# Patient Record
Sex: Female | Born: 1970 | Race: White | Hispanic: No | Marital: Married | Smoking: Current every day smoker
Health system: Southern US, Community
[De-identification: ages and names within clinical notes are randomized; demographics above are authoritative.]

## PROBLEM LIST (undated history)

## (undated) DIAGNOSIS — K219 Gastro-esophageal reflux disease without esophagitis: Secondary | ICD-10-CM

## (undated) HISTORY — DX: Gastro-esophageal reflux disease without esophagitis: K21.9

---

## 2016-04-16 HISTORY — PX: CERVICAL DISCECTOMY: SHX98

## 2019-11-15 HISTORY — PX: OTHER SURGICAL HISTORY: SHX169

## 2020-04-22 ENCOUNTER — Encounter (HOSPITAL_COMMUNITY): Payer: Self-pay

## 2020-04-22 ENCOUNTER — Other Ambulatory Visit: Payer: Self-pay

## 2020-04-22 DIAGNOSIS — N7093 Salpingitis and oophoritis, unspecified: Principal | ICD-10-CM | POA: Diagnosis present

## 2020-04-22 DIAGNOSIS — F17210 Nicotine dependence, cigarettes, uncomplicated: Secondary | ICD-10-CM | POA: Diagnosis present

## 2020-04-22 DIAGNOSIS — Z20822 Contact with and (suspected) exposure to covid-19: Secondary | ICD-10-CM | POA: Diagnosis present

## 2020-04-22 NOTE — ED Triage Notes (Signed)
Pt to er, pt c/o abd pain and bloating, states that Tuesday night she started having a fever, states that she just got back from the [place] on Monday.  States that she was eating fine yesterday, but today the fever continued and she had some bloating and diarrhea.

## 2020-04-23 ENCOUNTER — Emergency Department (HOSPITAL_COMMUNITY): Payer: Commercial Managed Care - PPO

## 2020-04-23 ENCOUNTER — Inpatient Hospital Stay (HOSPITAL_COMMUNITY)
Admission: EM | Admit: 2020-04-23 | Discharge: 2020-04-26 | DRG: 759 | Disposition: A | Discharge status: Home / Self Care | Payer: Commercial Managed Care - PPO | Attending: Obstetrics and Gynecology | Admitting: Obstetrics and Gynecology

## 2020-04-23 DIAGNOSIS — F17210 Nicotine dependence, cigarettes, uncomplicated: Secondary | ICD-10-CM | POA: Diagnosis not present

## 2020-04-23 DIAGNOSIS — Z20822 Contact with and (suspected) exposure to covid-19: Secondary | ICD-10-CM | POA: Diagnosis not present

## 2020-04-23 DIAGNOSIS — N7093 Salpingitis and oophoritis, unspecified: Secondary | ICD-10-CM | POA: Diagnosis present

## 2020-04-23 LAB — URINALYSIS, ROUTINE W REFLEX MICROSCOPIC
Bilirubin Urine: NEGATIVE
Glucose, UA: NEGATIVE mg/dL
Hgb urine dipstick: NEGATIVE
Ketones, ur: 20 mg/dL — AB
Nitrite: NEGATIVE
Protein, ur: 30 mg/dL — AB
Specific Gravity, Urine: 1.015 (ref 1.005–1.030)
WBC, UA: >50 WBC/hpf — ABNORMAL HIGH (ref 0–5)
pH: 7 (ref 5.0–8.0)

## 2020-04-23 LAB — COMPREHENSIVE METABOLIC PANEL
ALT: 12 U/L (ref 0–44)
AST: 12 U/L — ABNORMAL LOW (ref 15–41)
Albumin: 3.2 g/dL — ABNORMAL LOW (ref 3.5–5.0)
Alkaline Phosphatase: 51 U/L (ref 38–126)
Anion gap: 10 (ref 5–15)
BUN: 9 mg/dL (ref 6–20)
CO2: 28 mmol/L (ref 22–32)
Calcium: 8.1 mg/dL — ABNORMAL LOW (ref 8.9–10.3)
Chloride: 98 mmol/L (ref 98–111)
Creatinine, Ser: 0.62 mg/dL (ref 0.44–1.00)
GFR, Estimated: >60 mL/min (ref >60)
Glucose, Bld: 97 mg/dL (ref 70–99)
Potassium: 3.1 mmol/L — ABNORMAL LOW (ref 3.5–5.1)
Sodium: 136 mmol/L (ref 135–145)
Total Bilirubin: 0.7 mg/dL (ref 0.3–1.2)
Total Protein: 6.4 g/dL — ABNORMAL LOW (ref 6.5–8.1)

## 2020-04-23 LAB — CBC WITH DIFFERENTIAL/PLATELET
Abs Immature Granulocytes: 0.16 10*3/uL — ABNORMAL HIGH (ref 0.00–0.07)
Basophils Absolute: 0.1 10*3/uL (ref 0.0–0.1)
Basophils Relative: 0 %
Eosinophils Absolute: 0 10*3/uL (ref 0.0–0.5)
Eosinophils Relative: 0 %
HCT: 38.2 % (ref 36.0–46.0)
Hemoglobin: 12.5 g/dL (ref 12.0–15.0)
Immature Granulocytes: 1 %
Lymphocytes Relative: 10 %
Lymphs Abs: 1.7 10*3/uL (ref 0.7–4.0)
MCH: 27.8 pg (ref 26.0–34.0)
MCHC: 32.7 g/dL (ref 30.0–36.0)
MCV: 85.1 fL (ref 80.0–100.0)
Monocytes Absolute: 1.2 10*3/uL — ABNORMAL HIGH (ref 0.1–1.0)
Monocytes Relative: 7 %
Neutro Abs: 13.3 10*3/uL — ABNORMAL HIGH (ref 1.7–7.7)
Neutrophils Relative %: 82 %
Platelets: 189 10*3/uL (ref 150–400)
RBC: 4.49 MIL/uL (ref 3.87–5.11)
RDW: 13.2 % (ref 11.5–15.5)
WBC: 16.5 10*3/uL — ABNORMAL HIGH (ref 4.0–10.5)
nRBC: 0 % (ref 0.0–0.2)

## 2020-04-23 LAB — LIPASE, BLOOD: Lipase: 14 U/L (ref 11–51)

## 2020-04-23 LAB — SARS CORONAVIRUS 2 (TAT 6-24 HRS): SARS Coronavirus 2: NEGATIVE

## 2020-04-23 LAB — HCG, QUANTITATIVE, PREGNANCY: hCG, Beta Chain, Quant, S: <1 m[IU]/mL (ref <5)

## 2020-04-23 MED ORDER — DOXYCYCLINE HYCLATE 100 MG PO TABS
100.0000 mg | ORAL_TABLET | Freq: Two times a day (BID) | ORAL | Status: DC
Start: 2020-04-23 — End: 2020-04-26
  Administered 2020-04-23 – 2020-04-26 (×6): 100 mg via ORAL
  Filled 2020-04-23 (×6): qty 1

## 2020-04-23 MED ORDER — SODIUM CHLORIDE 0.9 % IV SOLN: 250.0000 mL | INTRAVENOUS | Status: DC | PRN

## 2020-04-23 MED ORDER — METRONIDAZOLE 500 MG PO TABS
500.0000 mg | ORAL_TABLET | Freq: Once | ORAL | Status: AC
  Administered 2020-04-23: 500 mg via ORAL
  Filled 2020-04-23: qty 1

## 2020-04-23 MED ORDER — IBUPROFEN 800 MG PO TABS
800.0000 mg | ORAL_TABLET | Freq: Three times a day (TID) | ORAL | Status: DC | PRN
  Administered 2020-04-24: 800 mg via ORAL
  Filled 2020-04-23: qty 1

## 2020-04-23 MED ORDER — SODIUM CHLORIDE 0.9 % IV SOLN
1.0000 g | INTRAVENOUS | Status: DC
  Administered 2020-04-23 – 2020-04-24 (×2): 1 g via INTRAVENOUS
  Filled 2020-04-23 (×2): qty 10

## 2020-04-23 MED ORDER — FENTANYL CITRATE (PF) 100 MCG/2ML IJ SOLN
100.0000 ug | INTRAMUSCULAR | Status: DC | PRN
  Administered 2020-04-23 (×2): 100 ug via INTRAVENOUS
  Filled 2020-04-23 (×2): qty 2

## 2020-04-23 MED ORDER — IOHEXOL 300 MG/ML  SOLN
100.0000 mL | Freq: Once | INTRAMUSCULAR | Status: AC | PRN
  Administered 2020-04-23: 100 mL via INTRAVENOUS

## 2020-04-23 MED ORDER — SODIUM CHLORIDE 0.9 % IV SOLN: 100.0000 mg | Freq: Two times a day (BID) | INTRAVENOUS | Status: DC

## 2020-04-23 MED ORDER — METRONIDAZOLE 500 MG PO TABS
500.0000 mg | ORAL_TABLET | Freq: Two times a day (BID) | ORAL | Status: DC
  Administered 2020-04-23 – 2020-04-26 (×6): 500 mg via ORAL
  Filled 2020-04-23 (×6): qty 1

## 2020-04-23 MED ORDER — SODIUM CHLORIDE 0.9% FLUSH
3.0000 mL | Freq: Two times a day (BID) | INTRAVENOUS | Status: DC
  Administered 2020-04-23 – 2020-04-25 (×5): 3 mL via INTRAVENOUS

## 2020-04-23 MED ORDER — ZOLPIDEM TARTRATE 5 MG PO TABS
5.0000 mg | ORAL_TABLET | Freq: Every evening | ORAL | Status: DC | PRN
  Administered 2020-04-25: 5 mg via ORAL
  Filled 2020-04-23: qty 1

## 2020-04-23 MED ORDER — ONDANSETRON HCL 4 MG/2ML IJ SOLN
4.0000 mg | Freq: Once | INTRAMUSCULAR | Status: AC
  Administered 2020-04-23: 4 mg via INTRAVENOUS
  Filled 2020-04-23: qty 2

## 2020-04-23 MED ORDER — DOXYCYCLINE HYCLATE 100 MG PO TABS
100.0000 mg | ORAL_TABLET | Freq: Once | ORAL | Status: AC
  Administered 2020-04-23: 100 mg via ORAL
  Filled 2020-04-23: qty 1

## 2020-04-23 MED ORDER — SODIUM CHLORIDE 0.9% FLUSH: 3.0000 mL | INTRAVENOUS | Status: DC | PRN

## 2020-04-23 MED ORDER — ONDANSETRON HCL 4 MG PO TABS: 4.0000 mg | ORAL_TABLET | Freq: Four times a day (QID) | ORAL | Status: DC | PRN

## 2020-04-23 MED ORDER — FENTANYL CITRATE (PF) 100 MCG/2ML IJ SOLN
50.0000 ug | Freq: Once | INTRAMUSCULAR | Status: AC
  Administered 2020-04-23: 50 ug via INTRAVENOUS
  Filled 2020-04-23: qty 2

## 2020-04-23 MED ORDER — SODIUM CHLORIDE 0.9 % IV BOLUS (SEPSIS)
1000.0000 mL | Freq: Once | INTRAVENOUS | Status: AC
  Administered 2020-04-23: 1000 mL via INTRAVENOUS

## 2020-04-23 MED ORDER — HYDROMORPHONE HCL 1 MG/ML IJ SOLN
1.0000 mg | INTRAMUSCULAR | Status: DC | PRN
  Administered 2020-04-23 – 2020-04-24 (×4): 1 mg via INTRAVENOUS
  Filled 2020-04-23 (×4): qty 1

## 2020-04-23 MED ORDER — FENTANYL CITRATE (PF) 100 MCG/2ML IJ SOLN
100.0000 ug | Freq: Once | INTRAMUSCULAR | Status: AC
Start: 2020-04-23 — End: 2020-04-23
  Administered 2020-04-23: 100 ug via INTRAVENOUS
  Filled 2020-04-23: qty 2

## 2020-04-23 MED ORDER — ONDANSETRON HCL 4 MG/2ML IJ SOLN
4.0000 mg | Freq: Four times a day (QID) | INTRAMUSCULAR | Status: DC | PRN
  Administered 2020-04-24: 4 mg via INTRAVENOUS
  Filled 2020-04-23: qty 2

## 2020-04-23 MED ORDER — SODIUM CHLORIDE 0.9 % IV SOLN
1.0000 g | Freq: Once | INTRAVENOUS | Status: AC
  Administered 2020-04-23: 1 g via INTRAVENOUS
  Filled 2020-04-23: qty 10

## 2020-04-23 MED ORDER — OXYCODONE-ACETAMINOPHEN 5-325 MG PO TABS
1.0000 | ORAL_TABLET | ORAL | Status: DC | PRN
  Administered 2020-04-24 (×3): 2 via ORAL
  Administered 2020-04-24: 1 via ORAL
  Administered 2020-04-25 (×2): 2 via ORAL
  Filled 2020-04-23: qty 1
  Filled 2020-04-23 (×6): qty 2

## 2020-04-23 MED ORDER — PANTOPRAZOLE SODIUM 40 MG PO TBEC
40.0000 mg | DELAYED_RELEASE_TABLET | Freq: Every day | ORAL | Status: DC
  Administered 2020-04-23 – 2020-04-26 (×4): 40 mg via ORAL
  Filled 2020-04-23 (×4): qty 1

## 2020-04-23 NOTE — ED Provider Notes (Signed)
[facility] EMERGENCY DEPARTMENT Provider Note   CSN: [id number] Arrival date & time: 04/22/20  2016     History Chief Complaint  Patient presents with  . Abdominal Pain    Meredith Hansen is a 50 y.o. female.  The history is provided by the patient.  Abdominal Pain Pain location:  Generalized Pain quality: aching and bloating   Pain radiates to:  Does not radiate Pain severity:  Severe Onset quality:  Gradual Duration:  3 days Timing:  Constant Progression:  Worsening Chronicity:  New Relieved by:  Nothing Worsened by:  Movement and palpation Associated symptoms: cough and fever   Associated symptoms: no chest pain, no diarrhea, no dysuria, no hematemesis, no vaginal bleeding, no vaginal discharge and no vomiting    Patient presents with abdominal pain.  Patient returned from the [place] around January 03.  Approximately 2 days later she began having generalized abdominal pain and fevers.  She had no vomiting or diarrhea at that time.  Today she took a suppository as well as Gas-X and Imodium.  She had some loose stool.  She reports the abdominal pain intensified today. She has never had this before She reports extensive traveling while in [place] including time in the [place]. She had a negative Covid test to re-enter the [place], and had a negative rapid test at home earlier in the day    PMH-none Soc hx - smoker  OB History   No obstetric history on file.     History reviewed. No pertinent family history.  Social History   Tobacco Use  . Smoking status: Current Every Day Smoker    Packs/day: 0.25    Types: Cigarettes  . Smokeless tobacco: Never Used  Vaping Use  . Vaping Use: Every day  Substance Use Topics  . Alcohol use: Never  . Drug use: Never    Home Medications Prior to Admission medications   Not on File    Allergies    Patient has no known allergies.  Review of Systems   Review of Systems  Constitutional:  Positive for fever.  Respiratory: Positive for cough.   Cardiovascular: Negative for chest pain.  Gastrointestinal: Positive for abdominal pain. Negative for diarrhea, hematemesis and vomiting.  Genitourinary: Negative for dysuria, vaginal bleeding and vaginal discharge.  All other systems reviewed and are negative.   Physical Exam Updated Vital Signs BP 110/71 (BP Location: Left Arm)   Pulse 96   Temp 98.7 F (37.1 C) (Oral)   Resp 16   Ht 1.753 m (5\' 9" )   Wt 90.7 kg   SpO2 94%   BMI 29.53 kg/m   Physical Exam CONSTITUTIONAL: Well developed/well nourished, uncomfortable appearing HEAD: Normocephalic/atraumatic EYES: EOMI/PERRL no icterus ENMT: Mucous membranes moist NECK: supple no meningeal signs SPINE/BACK:entire spine nontender CV: S1/S2 noted, no murmurs/rubs/gallops noted LUNGS: Lungs are clear to auscultation bilaterally, no apparent distress ABDOMEN: soft, diffuse moderate tenderness noted, there is guarding, bowel sounds noted throughout abdomen GU:no cva tenderness NEURO: Pt is awake/alert/appropriate, moves all extremitiesx4.  No facial droop.   EXTREMITIES: pulses normal/equal, full ROM SKIN: warm, color normal PSYCH: Mildly anxious  ED Results / Procedures / Treatments   Labs (all labs ordered are listed, but only abnormal results are displayed) Labs Reviewed  CBC WITH DIFFERENTIAL/PLATELET - Abnormal; Notable for the following components:      Result Value   WBC 16.5 (*)    Neutro Abs 13.3 (*)    Monocytes Absolute 1.2 (*)  Abs Immature Granulocytes 0.16 (*)    All other components within normal limits  COMPREHENSIVE METABOLIC PANEL - Abnormal; Notable for the following components:   Potassium 3.1 (*)    Calcium 8.1 (*)    Total Protein 6.4 (*)    Albumin 3.2 (*)    AST 12 (*)    All other components within normal limits  URINALYSIS, ROUTINE W REFLEX MICROSCOPIC - Abnormal; Notable for the following components:   APPearance HAZY (*)     Ketones, ur 20 (*)    Protein, ur 30 (*)    Leukocytes,Ua SMALL (*)    WBC, UA >50 (*)    Bacteria, UA RARE (*)    All other components within normal limits  SARS CORONAVIRUS 2 (TAT 6-24 HRS)  LIPASE, BLOOD  HCG, QUANTITATIVE, PREGNANCY  GC/CHLAMYDIA PROBE AMP ([place]) NOT AT [facility]    EKG None  Radiology CT ABDOMEN PELVIS W CONTRAST  Result Date: 04/23/2020 CLINICAL DATA:  Acute nonlocalized abdominal pain.  Fever. EXAM: CT ABDOMEN AND PELVIS WITH CONTRAST TECHNIQUE: Multidetector CT imaging of the abdomen and pelvis was performed using the standard protocol following bolus administration of intravenous contrast. CONTRAST:  100mL OMNIPAQUE IOHEXOL 300 MG/ML  SOLN COMPARISON:  None. 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 Trace pleural thickening in the lower lobes with scattered atelectasis. Hepatobiliary: No focal hepatic abnormality. Mild gallbladder distension. Small calcified gallstones in the gallbladder fundus, with a 14 mm stellate stone in the more proximal gallbladder. No gallbladder wall thickening or pericholecystic inflammation. No biliary dilatation. Pancreas: No ductal dilatation or inflammation. Spleen: Borderline splenomegaly with spleen spanning 13.6 cm cranial caudal. No focal abnormality. Adrenals/Urinary Tract: Normal adrenal glands. No hydronephrosis. No perinephric edema. There is a punctate nonobstructing stone in the lower left kidney. Symmetric renal excretion on delayed phase imaging. The urinary bladder is minimally distended. There is no bladder wall thickening. Stomach/Bowel: Decompressed stomach. Normal positioning of the duodenum and ligament of Treitz. Occasional fluid-filled small bowel loops without abnormal distension or obstruction. Appendix is upper normal in size, filled with air and small amount of intraluminal contrast. No evidence of appendicitis. Occasional  colonic diverticula of the descending colon. No acute diverticulitis. Vascular/Lymphatic: Normal caliber abdominal aorta. Patent portal vein. Multiple prominent retroperitoneal lymph nodes, not enlarged by size criteria and typically reactive. Reproductive: Thick-walled tubular structure in the left greater than right adnexa suspicious for tubo-ovarian abscess. There is generalized fat stranding of the pelvic fat. Small amount of free fluid tracks into the pericolic gutters and adnexa. Uterus is slightly heterogeneous. Other: Abdominoplasty.  No free air.  Trace perihepatic ascites. Musculoskeletal: There are no acute or suspicious osseous abnormalities. IMPRESSION: 1. Thick-walled tubular structure in the left greater than right adnexa suspicious for tubo-ovarian abscess. There is generalized edema and fat stranding of the intrapelvic fat. Small amount of free fluid tracks into the pericolic gutters and adnexa. 2. Cholelithiasis without gallbladder inflammation. 3. Nonobstructing left nephrolithiasis. 4. Mild colonic diverticulosis without diverticulitis. 5. 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 Electronically Signed   By: Narda RutherfordMelanie  Sanford M.D.   On: 04/23/2020 04:09    Procedures .Critical Care Performed by: Zadie RhineWickline, Eirik Schueler, MD Authorized by: Zadie RhineWickline, Cassidee Deats, MD   Critical care provider statement:    Critical care time (minutes):  60   Critical care start time:  04/23/2020 4:30 AM  Critical care end time:  04/23/2020 5:30 AM   Critical care time was exclusive of:  Separately billable procedures and treating other patients   Critical care was necessary to treat or prevent imminent or life-threatening deterioration of the following conditions:  Sepsis and shock   Critical care was time spent personally by me on the following activities:  Development of treatment plan with patient or surrogate, discussions with consultants,  examination of patient, evaluation of patient's response to treatment, re-evaluation of patient's condition, pulse oximetry, ordering and review of radiographic studies, ordering and review of laboratory studies and ordering and performing treatments and interventions   I assumed direction of critical care for this patient from another provider in my specialty: no     Care discussed with: admitting provider       Medications Ordered in ED Medications  sodium chloride 0.9 % bolus 1,000 mL (0 mLs Intravenous Stopped 04/23/20 0452)  ondansetron (ZOFRAN) injection 4 mg (4 mg Intravenous Given 04/23/20 0229)  fentaNYL (SUBLIMAZE) injection 100 mcg (100 mcg Intravenous Given 04/23/20 0229)  iohexol (OMNIPAQUE) 300 MG/ML solution 100 mL (100 mLs Intravenous Contrast Given 04/23/20 0333)  sodium chloride 0.9 % bolus 1,000 mL (1,000 mLs Intravenous New Bag/Given 04/23/20 0501)  fentaNYL (SUBLIMAZE) injection 50 mcg (50 mcg Intravenous Given 04/23/20 0501)  cefTRIAXone (ROCEPHIN) 1 g in sodium chloride 0.9 % 100 mL IVPB (1 g Intravenous New Bag/Given 04/23/20 0503)  doxycycline (VIBRA-TABS) tablet 100 mg (100 mg Oral Given 04/23/20 0501)  metroNIDAZOLE (FLAGYL) tablet 500 mg (500 mg Oral Given 04/23/20 0501)    ED Course  I have reviewed the triage vital signs and the nursing notes.  Pertinent labs & imaging results that were available during my care of the patient were reviewed by me and considered in my medical decision making (see chart for details).    MDM Rules/Calculators/A&P                           This patient presents to the ED for concern of abdominal pain, this involves an extensive number of treatment options, and is a complaint that carries with it a high risk of complications and morbidity.  The differential diagnosis includes cholecystitis, pancreatitis, appendicitis, UTI, kidney stone, bowel perforation   Lab Tests:   I Ordered, reviewed, and interpreted labs, which included electrolytes,  complete blood count, lipase, urinalysis  Medicines ordered:   I ordered medication fentanyl for pain  Imaging Studies ordered:   I ordered imaging studies which included CT abdomen pelvis  I independently visualized and interpreted imaging which showed tubo-ovarian abscess    Consultations Obtained:   I consulted gynecology Dr Debroah Loop  and discussed lab and imaging findings  Reevaluation:  After the interventions stated above, I reevaluated the patient and found pt improved  Critical Interventions:  . IV fluids, IV antibiotics, admission to gynecology   5:47 AM Patient presented with generalized abdominal pain that began several days after returning from the [place]. Patient described bloating and pain. She had no significant diarrhea Patient had significant tenderness on exam, she was tachycardic and had an elevated white blood cell count. She required over 2 L of normal saline CT scan was ordered to rule out any acute intra-abdominal process. CT scan reveals tubo-ovarian abscess. Patient denies any pelvic complaints, and she reports she has not had sexual intercourse in over a decade Discussed the case with Dr. Debroah Loop with gynecology. He has reviewed the  work-up. Plan will be to start antibiotics and transfer to his service at [facility] Patient is agreeable plan Final Clinical Impression(s) / ED Diagnoses Final diagnoses:  TOA (tubo-ovarian abscess)    Rx / DC Orders ED Discharge Orders    None       Zadie Rhine, MD 04/23/20 [id number]

## 2020-04-23 NOTE — ED Notes (Addendum)
Instructed to call [phone] to speak to MD on call Dr. Alysia Penna to discuss.  Called and MD will put in some orders

## 2020-04-23 NOTE — ED Notes (Signed)
Pt doesn't have a bed at this time, called pt placement, it is anticipated that pt will get a bed after discharges are complete at [facility], notified MD Debroah Loop via text to determine plan of care between now and when she gets a bed at [facility]

## 2020-04-23 NOTE — ED Notes (Signed)
Pt in bed, pt states that her pain is coming back, requests pain med, pain med given

## 2020-04-24 DIAGNOSIS — F17210 Nicotine dependence, cigarettes, uncomplicated: Secondary | ICD-10-CM | POA: Diagnosis present

## 2020-04-24 DIAGNOSIS — Z20822 Contact with and (suspected) exposure to covid-19: Secondary | ICD-10-CM | POA: Diagnosis present

## 2020-04-24 DIAGNOSIS — N7093 Salpingitis and oophoritis, unspecified: Secondary | ICD-10-CM | POA: Diagnosis present

## 2020-04-24 LAB — CBC WITH DIFFERENTIAL/PLATELET
Abs Immature Granulocytes: 0.09 10*3/uL — ABNORMAL HIGH (ref 0.00–0.07)
Basophils Absolute: 0 10*3/uL (ref 0.0–0.1)
Basophils Relative: 0 %
Eosinophils Absolute: 0.1 10*3/uL (ref 0.0–0.5)
Eosinophils Relative: 1 %
HCT: 34 % — ABNORMAL LOW (ref 36.0–46.0)
Hemoglobin: 10.9 g/dL — ABNORMAL LOW (ref 12.0–15.0)
Immature Granulocytes: 1 %
Lymphocytes Relative: 14 %
Lymphs Abs: 1.1 10*3/uL (ref 0.7–4.0)
MCH: 27.5 pg (ref 26.0–34.0)
MCHC: 32.1 g/dL (ref 30.0–36.0)
MCV: 85.9 fL (ref 80.0–100.0)
Monocytes Absolute: 0.8 10*3/uL (ref 0.1–1.0)
Monocytes Relative: 10 %
Neutro Abs: 5.5 10*3/uL (ref 1.7–7.7)
Neutrophils Relative %: 74 %
Platelets: 166 10*3/uL (ref 150–400)
RBC: 3.96 MIL/uL (ref 3.87–5.11)
RDW: 13.2 % (ref 11.5–15.5)
WBC: 7.5 10*3/uL (ref 4.0–10.5)
nRBC: 0 % (ref 0.0–0.2)

## 2020-04-24 LAB — RAPID HIV SCREEN (HIV 1/2 AB+AG)
HIV 1/2 Antibodies: NONREACTIVE
HIV-1 P24 Antigen - HIV24: NONREACTIVE

## 2020-04-24 LAB — VITAMIN D 25 HYDROXY (VIT D DEFICIENCY, FRACTURES): Vit D, 25-Hydroxy: 33.9 ng/mL (ref 30–100)

## 2020-04-24 LAB — TYPE AND SCREEN
ABO/RH(D): A POS
Antibody Screen: NEGATIVE

## 2020-04-24 LAB — PROTIME-INR
INR: 1.3 — ABNORMAL HIGH (ref 0.8–1.2)
Prothrombin Time: 15.4 seconds — ABNORMAL HIGH (ref 11.4–15.2)

## 2020-04-24 LAB — APTT: aPTT: 30 seconds (ref 24–36)

## 2020-04-24 LAB — ABO/RH: ABO/RH(D): A POS

## 2020-04-24 LAB — RPR: RPR Ser Ql: NONREACTIVE

## 2020-04-24 LAB — HEPATITIS C ANTIBODY: HCV Ab: REACTIVE — AB

## 2020-04-24 LAB — HEPATITIS B SURFACE ANTIGEN: Hepatitis B Surface Ag: NONREACTIVE

## 2020-04-24 NOTE — H&P (Signed)
Meredith Hansen is an 50 y.o. [phone] female who presented to [facility] with abd pain. Pain started on Friday. W/U in ER revealed a left TOA. She was transferred to our service for further management.  Pt reports last intercourse 1 week ago, new partner, no protection. Previous intercourse had been over 15 yrs ago. Denies H/O STD. Last pap unknown, ? Abnormal pap in the past but pt can not recall any specific details.  Cycles monthly. However took medication in December to stop cycle due to traveling to [place].   Denies any chronic medical problems or medications  C/S x 3. SAB x 1  H/O tummy tuck  Menstrual History: Menarche age: 28 No LMP recorded.    History reviewed. No pertinent past medical history.  History reviewed. No pertinent surgical history.  History reviewed. No pertinent family history.  Social History:  reports that she has been smoking cigarettes. She has been smoking about 0.25 packs per day. She has never used smokeless tobacco. She reports that she does not drink alcohol and does not use drugs.  Allergies: No Known Allergies  Medications Prior to Admission  Medication Sig Dispense Refill Last Dose  . omeprazole (PRILOSEC) 40 MG capsule Take 40 mg by mouth daily.       Review of Systems  Constitutional: Negative.   Cardiovascular: Negative.   Gastrointestinal: Positive for abdominal pain.  Genitourinary: Positive for pelvic pain.    Blood pressure 100/72, pulse 90, temperature 98.2 F (36.8 C), temperature source Oral, resp. rate 16, height 5\' 9"  (1.753 m), weight 90.7 kg, SpO2 95 %. Physical Exam Constitutional:      Appearance: She is well-developed.  Cardiovascular:     Heart sounds: Normal heart sounds.  Pulmonary:     Effort: Pulmonary effort is normal.     Breath sounds: Normal breath sounds.  Abdominal:     General: Bowel sounds are normal.     Palpations: Abdomen is soft.     Tenderness: There is generalized abdominal tenderness.   Genitourinary:    Comments: Deferred  Neurological:     Mental Status: She is alert.     No results found for this or any previous visit (from the past 24 hour(s)).  CT ABDOMEN PELVIS W CONTRAST  Result Date: 04/23/2020 CLINICAL DATA:  Acute nonlocalized abdominal pain.  Fever. EXAM: CT ABDOMEN AND PELVIS WITH CONTRAST TECHNIQUE: Multidetector CT imaging of the abdomen and pelvis was performed using the standard protocol following bolus administration of intravenous contrast. CONTRAST:  06/21/2020 OMNIPAQUE IOHEXOL 300 MG/ML  SOLN COMPARISON:  None. 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 Trace pleural thickening in the lower lobes with scattered atelectasis. Hepatobiliary: No focal hepatic abnormality. Mild gallbladder distension. Small calcified gallstones in the gallbladder fundus, with a 14 mm stellate stone in the more proximal gallbladder. No gallbladder wall thickening or pericholecystic inflammation. No biliary dilatation. Pancreas: No ductal dilatation or inflammation. Spleen: Borderline splenomegaly with spleen spanning 13.6 cm cranial caudal. No focal abnormality. Adrenals/Urinary Tract: Normal adrenal glands. No hydronephrosis. No perinephric edema. There is a punctate nonobstructing stone in the lower left kidney. Symmetric renal excretion on delayed phase imaging. The urinary bladder is minimally distended. There is no bladder wall thickening. Stomach/Bowel: Decompressed stomach. Normal positioning of the duodenum and ligament of Treitz. Occasional fluid-filled small bowel loops without abnormal distension or obstruction. Appendix is upper normal in size, filled with air and small  amount of intraluminal contrast. No evidence of appendicitis. Occasional colonic diverticula of the descending colon. No acute diverticulitis. Vascular/Lymphatic: Normal caliber abdominal aorta. Patent portal vein.  Multiple prominent retroperitoneal lymph nodes, not enlarged by size criteria and typically reactive. Reproductive: Thick-walled tubular structure in the left greater than right adnexa suspicious for tubo-ovarian abscess. There is generalized fat stranding of the pelvic fat. Small amount of free fluid tracks into the pericolic gutters and adnexa. Uterus is slightly heterogeneous. Other: Abdominoplasty.  No free air.  Trace perihepatic ascites. Musculoskeletal: There are no acute or suspicious osseous abnormalities. IMPRESSION: 1. Thick-walled tubular structure in the left greater than right adnexa suspicious for tubo-ovarian abscess. There is generalized edema and fat stranding of the intrapelvic fat. Small amount of free fluid tracks into the pericolic gutters and adnexa. 2. Cholelithiasis without gallbladder inflammation. 3. Nonobstructing left nephrolithiasis. 4. Mild colonic diverticulosis without diverticulitis. 5. 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 Electronically Signed   By: Narda Rutherford M.D.   On: 04/23/2020 04:09    Assessment/Plan: Left TOA  Pt will be admitted for IV antibiotics. Pain medication as needed. POC reviewed with pt.   Hermina Staggers 04/24/2020, 6:02 AM

## 2020-04-25 DIAGNOSIS — N7093 Salpingitis and oophoritis, unspecified: Principal | ICD-10-CM

## 2020-04-25 LAB — CBC WITH DIFFERENTIAL/PLATELET
Abs Immature Granulocytes: 0.03 10*3/uL (ref 0.00–0.07)
Basophils Absolute: 0 10*3/uL (ref 0.0–0.1)
Basophils Relative: 0 %
Eosinophils Absolute: 0.1 10*3/uL (ref 0.0–0.5)
Eosinophils Relative: 1 %
HCT: 34.4 % — ABNORMAL LOW (ref 36.0–46.0)
Hemoglobin: 11.2 g/dL — ABNORMAL LOW (ref 12.0–15.0)
Immature Granulocytes: 0 %
Lymphocytes Relative: 11 %
Lymphs Abs: 1 10*3/uL (ref 0.7–4.0)
MCH: 27.7 pg (ref 26.0–34.0)
MCHC: 32.6 g/dL (ref 30.0–36.0)
MCV: 84.9 fL (ref 80.0–100.0)
Monocytes Absolute: 0.6 10*3/uL (ref 0.1–1.0)
Monocytes Relative: 7 %
Neutro Abs: 6.7 10*3/uL (ref 1.7–7.7)
Neutrophils Relative %: 81 %
Platelets: 200 10*3/uL (ref 150–400)
RBC: 4.05 MIL/uL (ref 3.87–5.11)
RDW: 13.2 % (ref 11.5–15.5)
WBC: 8.4 10*3/uL (ref 4.0–10.5)
nRBC: 0 % (ref 0.0–0.2)

## 2020-04-25 LAB — GC/CHLAMYDIA PROBE AMP (~~LOC~~) NOT AT ARMC
Chlamydia: NEGATIVE
Comment: NEGATIVE
Comment: NORMAL
Neisseria Gonorrhea: NEGATIVE

## 2020-04-25 MED ORDER — BISACODYL 10 MG RE SUPP
10.0000 mg | Freq: Once | RECTAL | Status: AC
  Administered 2020-04-25: 10 mg via RECTAL
  Filled 2020-04-25: qty 1

## 2020-04-25 MED ORDER — IBUPROFEN 800 MG PO TABS
800.0000 mg | ORAL_TABLET | Freq: Three times a day (TID) | ORAL | Status: DC
  Administered 2020-04-25 – 2020-04-26 (×3): 800 mg via ORAL
  Filled 2020-04-25 (×3): qty 1

## 2020-04-25 MED ORDER — OXYCODONE HCL 5 MG PO TABS
5.0000 mg | ORAL_TABLET | ORAL | Status: DC | PRN
  Administered 2020-04-25 (×2): 5 mg via ORAL
  Filled 2020-04-25 (×2): qty 1

## 2020-04-25 MED ORDER — ACETAMINOPHEN 325 MG PO TABS: 650.0000 mg | ORAL_TABLET | Freq: Four times a day (QID) | ORAL | Status: DC | PRN

## 2020-04-25 NOTE — Plan of Care (Signed)
  Problem: Education: Goal: Knowledge of General Education information will improve Description: Including pain rating scale, medication(s)/side effects and non-pharmacologic comfort measures Outcome: Progressing   Problem: Health Behavior/Discharge Planning: Goal: Ability to manage health-related needs will improve Outcome: Progressing   Problem: Clinical Measurements: Goal: Ability to maintain clinical measurements within normal limits will improve Outcome: Progressing Goal: Will remain free from infection Outcome: Progressing Goal: Diagnostic test results will improve Outcome: Progressing Goal: Respiratory complications will improve Outcome: Progressing Goal: Cardiovascular complication will be avoided Outcome: Progressing   Problem: Activity: Goal: Risk for activity intolerance will decrease Outcome: Progressing   Problem: Nutrition: Goal: Adequate nutrition will be maintained Outcome: Progressing   Problem: Coping: Goal: Level of anxiety will decrease Outcome: Progressing   Problem: Elimination: Goal: Will not experience complications related to bowel motility Outcome: Progressing Goal: Will not experience complications related to urinary retention Outcome: Progressing   Problem: Pain Managment: Goal: General experience of comfort will improve Outcome: Progressing   Problem: Safety: Goal: Ability to remain free from injury will improve Outcome: Progressing   Problem: Skin Integrity: Goal: Risk for impaired skin integrity will decrease Outcome: Progressing   Problem: Education: Goal: Knowledge of the prescribed therapeutic regimen will improve Outcome: Progressing Goal: Understanding of sexual limitations or changes related to disease process or condition will improve Outcome: Progressing Goal: Individualized Educational Video(s) Outcome: Progressing   Problem: Self-Concept: Goal: Communication of feelings regarding changes in body function or  appearance will improve Outcome: Progressing   Problem: Skin Integrity: Goal: Demonstration of wound healing without infection will improve Outcome: Progressing   

## 2020-04-25 NOTE — Progress Notes (Signed)
    Gynecology Progress Note  Admission Date: 04/23/2020 Current Date: 04/25/2020 10:23 AM  Meredith Hansen is a 50 y.o.  HD#2 admitted for left TOA   History complicated by: Patient Active Problem List   Diagnosis Date Noted  . Tubo-ovarian abscess 04/23/2020  . TOA (tubo-ovarian abscess) 04/23/2020    Subjective:  Patient reports pain in abdomen mildly improved, still having bloating and discomfort. Tolerating PO diet without nausea/vomiting. Has not had BM in 4 days. Ambulating without issue. Feels "foggy" when taking narcotic pain meds. Overall, feeling better.  Objective:   Vitals:   04/24/20 0418 04/24/20 1003 04/24/20 1448 04/24/20 2100  BP: 100/72 (!) 99/58 107/69 104/70  Pulse: 90 76 88 93  Resp: 16 16 18 18   Temp: 98.2 F (36.8 C) 98.7 F (37.1 C) 97.8 F (36.6 C) 98.1 F (36.7 C)  TempSrc: Oral Oral Oral Oral  SpO2: 95% 96% 97% 93%  Weight:      Height:        No intake/output data recorded.  Intake/Output Summary (Last 24 hours) at 04/25/2020 1023 Last data filed at 04/24/2020 1300 Gross per 24 hour  Intake 200 ml  Output --  Net 200 ml     Physical exam: BP 104/70 (BP Location: Left Arm)   Pulse 93   Temp 98.1 F (36.7 C) (Oral)   Resp 18   Ht 5\' 9"  (1.753 m)   Wt 90.7 kg   SpO2 93%   BMI 29.53 kg/m  CONSTITUTIONAL: Well-developed, well-nourished female in no acute distress.  HENT:  Normocephalic, atraumatic, External right and left ear normal. Oropharynx is clear and moist EYES: Conjunctivae and EOM are normal. Pupils are equal, round, and reactive to light. No scleral icterus.  NECK: Normal range of motion, supple, no masses.  Normal thyroid.  SKIN: Skin is warm and dry. No rash noted. Not diaphoretic. No erythema. No pallor. NEUROLOGIC: Alert and oriented to person, place, and time. Normal reflexes, muscle tone coordination. No cranial nerve deficit noted. PSYCHIATRIC: Normal mood and affect. Normal behavior. Normal judgment and thought  content. CARDIOVASCULAR: Normal heart rate noted RESPIRATORY: Effort normal, no problems with respiration noted. ABDOMEN: Softly distended, mildly tender, no rebound or guarding.  PELVIC: deferred MUSCULOSKELETAL: Normal range of motion. No tenderness.  No cyanosis, clubbing, or edema.    UOP: voiding spontaneously  Labs  Recent Labs  Lab 04/23/20 0117 04/24/20 0926 04/25/20 0139  WBC 16.5* 7.5 8.4  HGB 12.5 10.9* 11.2*  HCT 38.2 34.0* 34.4*  PLT 189 166 200     Assessment & Plan:   Patient is 50 y.o. admitted for left TOA, started on rocephin, doxy, flagyl. She is doing slightly better, will adjust pain medication to try to decrease narcotics due to patient's complains about feeling foggy on them.   Reviewed course and plan for discharge (and 14 days abx) once clinically improved, suspect abdominal distention related to lack of bowel movement recently, suppository ordered. Pt verbalizes understanding and is in agreement.   Cont abx Suppository ordered Possible dc later today, likely tomorrow Cont regular diet   For OB/GYN issues, please call the Center for [facility] at [facility] Monday - Friday, 8 am - 5 pm: [phone] Monday All other times: [phone] Sunday   [phone], MD, [facility] [place] Attending Center for [facility]

## 2020-04-26 MED ORDER — IBUPROFEN 800 MG PO TABS: 800.0000 mg | ORAL_TABLET | Freq: Three times a day (TID) | ORAL | 0 refills | Status: DC

## 2020-04-26 MED ORDER — OXYCODONE HCL 5 MG PO TABS: 5.0000 mg | ORAL_TABLET | ORAL | 0 refills | Status: DC | PRN

## 2020-04-26 MED ORDER — METRONIDAZOLE 500 MG PO TABS: 500.0000 mg | ORAL_TABLET | Freq: Two times a day (BID) | ORAL | 0 refills | Status: DC

## 2020-04-26 MED ORDER — DOXYCYCLINE HYCLATE 100 MG PO TABS: 100.0000 mg | ORAL_TABLET | Freq: Two times a day (BID) | ORAL | 0 refills | Status: DC

## 2020-04-26 NOTE — Discharge Summary (Signed)
Physician Discharge Summary  Patient ID: Meredith Hansen MRN: [id number] DOB/AGE: 1970-11-23 50 y.o.  Admit date: 04/23/2020 Discharge date: 04/26/2020  Admission Diagnoses: tubo-ovarian abscess  Discharge Diagnoses:  Active Problems:   Tubo-ovarian abscess   TOA (tubo-ovarian abscess)   Discharged Condition: good  Hospital Course: Please see HPI dated 04/23/2020 for full details. Briefly, this is a 50 y.o. female admitted for tubo-ovarian abcess and started on antibiotics. Her hospital course was uncomplicated. By HD#2 she was eating without nausea/vomiting, voiding spontaneously without issue, passing flatus. Her pain was well controlled on PO pain meds and she was ambulating without difficulty. Abdominal pain was significantly improved after 48 hours IV meds and she was ready for discharge. Gave instructions and 14 days antibiotics, reviewed importance of follow up and taking entire course.  She was deemed stable for discharge, instructions for follow up given. She will follow up in office in 2 weeks.   Physical Exam:  BP 102/79 (BP Location: Left Arm)   Pulse 72   Temp 98 F (36.7 C) (Oral)   Resp 17   Ht 5\' 9"  (1.753 m)   Wt 90.7 kg   SpO2 98%   BMI 29.53 kg/m  General: alert, oriented, cooperative Chest: normal respiratory effort Heart: RRR  Abdomen: soft, appropriately tender to palpation  DVT Evaluation: no evidence of DVT Extremities: no edema, no calf tenderness  Labs: Lab Results  Component Value Date   WBC 8.4 04/25/2020   HGB 11.2 (L) 04/25/2020   HCT 34.4 (L) 04/25/2020   MCV 84.9 04/25/2020   PLT 200 04/25/2020   CMP Latest Ref Rng & Units 04/23/2020  Glucose 70 - 99 mg/dL 97  BUN 6 - 20 mg/dL 9  Creatinine 06/21/2020 - 0.17 mg/dL 7.93  Sodium 9.03 - 009 mmol/L 136  Potassium 3.5 - 5.1 mmol/L 3.1(L)  Chloride 98 - 111 mmol/L 98  CO2 22 - 32 mmol/L 28  Calcium 8.9 - 10.3 mg/dL 8.1(L)  Total Protein 6.5 - 8.1 g/dL 6.4(L)  Total Bilirubin 0.3 - 1.2 mg/dL  0.7  Alkaline Phos 38 - 126 U/L 51  AST 15 - 41 U/L 12(L)  ALT 0 - 44 U/L 12   Disposition: Discharge disposition: 01-Home or Self Care       Discharge Instructions    Call MD for:  difficulty breathing, headache or visual disturbances   Complete by: As directed    Call MD for:  extreme fatigue   Complete by: As directed    Call MD for:  hives   Complete by: As directed    Call MD for:  persistant dizziness or light-headedness   Complete by: As directed    Call MD for:  persistant nausea and vomiting   Complete by: As directed    Call MD for:  redness, tenderness, or signs of infection (pain, swelling, redness, odor or green/yellow discharge around incision site)   Complete by: As directed    Call MD for:  severe uncontrolled pain   Complete by: As directed    Call MD for:  temperature >100.4   Complete by: As directed    Diet - low sodium heart healthy   Complete by: As directed    Discharge wound care:   Complete by: As directed    Wash with warm, soapy water. Pat dry, do not scrub.   Increase activity slowly   Complete by: As directed    May shower / Bathe   Complete by: As directed  An After Visit Summary was printed and given to the patient. Allergies as of 04/26/2020   No Known Allergies     Medication List    TAKE these medications   doxycycline 100 MG tablet Commonly known as: VIBRA-TABS Take 1 tablet (100 mg total) by mouth every 12 (twelve) hours.   ibuprofen 800 MG tablet Commonly known as: ADVIL Take 1 tablet (800 mg total) by mouth 3 (three) times daily.   metroNIDAZOLE 500 MG tablet Commonly known as: FLAGYL Take 1 tablet (500 mg total) by mouth every 12 (twelve) hours.   omeprazole 40 MG capsule Commonly known as: PRILOSEC Take 40 mg by mouth daily.   oxyCODONE 5 MG immediate release tablet Commonly known as: Oxy IR/ROXICODONE Take 1 tablet (5 mg total) by mouth every 4 (four) hours as needed for severe pain.             Discharge Care Instructions  (From admission, onward)         Start     Ordered   04/26/20 0000  Discharge wound care:       Comments: Wash with warm, soapy water. Pat dry, do not scrub.   04/26/20 0943          Follow-up Information    [facility] Follow up.   Specialty: Obstetrics and Gynecology Contact information: [address] [place] [address] [phone]              Signed: Conan Bowens 04/26/2020, 9:44 AM

## 2020-04-26 NOTE — Progress Notes (Signed)
Patient given discharge instructions and stated understanding. 

## 2020-04-26 NOTE — Plan of Care (Signed)
Problem: Education: Goal: Knowledge of General Education information will improve Description: Including pain rating scale, medication(s)/side effects and non-pharmacologic comfort measures 04/26/2020 1035 by Coy Saunas, RN Outcome: Adequate for Discharge 04/26/2020 1035 by Coy Saunas, RN Outcome: Adequate for Discharge   Problem: Health Behavior/Discharge Planning: Goal: Ability to manage health-related needs will improve 04/26/2020 1035 by Coy Saunas, RN Outcome: Adequate for Discharge 04/26/2020 1035 by Coy Saunas, RN Outcome: Adequate for Discharge   Problem: Clinical Measurements: Goal: Ability to maintain clinical measurements within normal limits will improve 04/26/2020 1035 by Coy Saunas, RN Outcome: Adequate for Discharge 04/26/2020 1035 by Coy Saunas, RN Outcome: Adequate for Discharge Goal: Will remain free from infection 04/26/2020 1035 by Coy Saunas, RN Outcome: Adequate for Discharge 04/26/2020 1035 by Coy Saunas, RN Outcome: Adequate for Discharge Goal: Diagnostic test results will improve 04/26/2020 1035 by Coy Saunas, RN Outcome: Adequate for Discharge 04/26/2020 1035 by Coy Saunas, RN Outcome: Adequate for Discharge Goal: Respiratory complications will improve 04/26/2020 1035 by Coy Saunas, RN Outcome: Adequate for Discharge 04/26/2020 1035 by Coy Saunas, RN Outcome: Adequate for Discharge Goal: Cardiovascular complication will be avoided 04/26/2020 1035 by Coy Saunas, RN Outcome: Adequate for Discharge 04/26/2020 1035 by Coy Saunas, RN Outcome: Adequate for Discharge   Problem: Activity: Goal: Risk for activity intolerance will decrease 04/26/2020 1035 by Coy Saunas, RN Outcome: Adequate for Discharge 04/26/2020 1035 by Coy Saunas, RN Outcome: Adequate for Discharge   Problem: Nutrition: Goal: Adequate nutrition will be maintained 04/26/2020 1035 by Coy Saunas, RN Outcome: Adequate for  Discharge 04/26/2020 1035 by Coy Saunas, RN Outcome: Adequate for Discharge   Problem: Coping: Goal: Level of anxiety will decrease 04/26/2020 1035 by Coy Saunas, RN Outcome: Adequate for Discharge 04/26/2020 1035 by Coy Saunas, RN Outcome: Adequate for Discharge   Problem: Elimination: Goal: Will not experience complications related to bowel motility 04/26/2020 1035 by Coy Saunas, RN Outcome: Adequate for Discharge 04/26/2020 1035 by Coy Saunas, RN Outcome: Adequate for Discharge Goal: Will not experience complications related to urinary retention 04/26/2020 1035 by Coy Saunas, RN Outcome: Adequate for Discharge 04/26/2020 1035 by Coy Saunas, RN Outcome: Adequate for Discharge   Problem: Pain Managment: Goal: General experience of comfort will improve 04/26/2020 1035 by Coy Saunas, RN Outcome: Adequate for Discharge 04/26/2020 1035 by Coy Saunas, RN Outcome: Adequate for Discharge   Problem: Safety: Goal: Ability to remain free from injury will improve 04/26/2020 1035 by Coy Saunas, RN Outcome: Adequate for Discharge 04/26/2020 1035 by Coy Saunas, RN Outcome: Adequate for Discharge   Problem: Skin Integrity: Goal: Risk for impaired skin integrity will decrease 04/26/2020 1035 by Coy Saunas, RN Outcome: Adequate for Discharge 04/26/2020 1035 by Coy Saunas, RN Outcome: Adequate for Discharge   Problem: Education: Goal: Knowledge of the prescribed therapeutic regimen will improve 04/26/2020 1035 by Coy Saunas, RN Outcome: Adequate for Discharge 04/26/2020 1035 by Coy Saunas, RN Outcome: Adequate for Discharge Goal: Understanding of sexual limitations or changes related to disease process or condition will improve 04/26/2020 1035 by Coy Saunas, RN Outcome: Adequate for Discharge 04/26/2020 1035 by Coy Saunas, RN Outcome: Adequate for Discharge Goal: Individualized Educational Video(s) 04/26/2020 1035 by Coy Saunas, RN Outcome: Adequate for Discharge 04/26/2020 1035 by Coy Saunas, RN Outcome: Adequate for Discharge   Problem: Self-Concept: Goal:  Communication of feelings regarding changes in body function or appearance will improve 04/26/2020 1035 by Coy Saunas, RN Outcome: Adequate for Discharge 04/26/2020 1035 by Coy Saunas, RN Outcome: Adequate for Discharge   Problem: Skin Integrity: Goal: Demonstration of wound healing without infection will improve 04/26/2020 1035 by Coy Saunas, RN Outcome: Adequate for Discharge 04/26/2020 1035 by Coy Saunas, RN Outcome: Adequate for Discharge

## 2020-04-26 NOTE — Discharge Instructions (Signed)
Pelvic Inflammatory Disease  Pelvic inflammatory disease (PID) is caused by an infection in some or all of the female reproductive organs. The infection can be in the uterus, ovaries, fallopian tubes, or the surrounding tissues in the pelvis. PID can cause abdominal or pelvic pain that comes on suddenly (acute pelvic pain). PID is a serious infection because it can lead to lasting (chronic) pelvic pain or the inability to have children (infertility). What are the causes? This condition is most often caused by bacteria that is spread during sexual contact. It can also be caused by a bacterial infection of the vagina (bacterial vaginosis) that is not spread by sexual contact. This condition occurs when the infection is not treated and the bacteria travel upward from the vagina or cervix into the reproductive organs. Bacteria may also be introduced into the reproductive organs following:  The birth of a baby.  A miscarriage.  An abortion.  Major pelvic surgery.  The insertion of an intrauterine device (IUD).  A sexual assault. What increases the risk? You are more likely to develop this condition if you:  Are younger than 49 years of age.  Are sexually active at a young age.  Have a history of STI (sexually transmitted infection) or PID.  Do not regularly use barrier contraception methods, such as condoms.  Have multiple sexual partners.  Have sex with someone who has symptoms of an STI.  Use a vaginal douche.  Have recently had an IUD inserted. What are the signs or symptoms? Symptoms of this condition include:  Abdominal or pelvic pain.  Fever.  Chills.  Abnormal vaginal discharge.  Abnormal uterine bleeding.  Unusual pain shortly after the end of a menstrual period.  Painful urination.  Pain with sex.  Nausea and vomiting. How is this diagnosed? This condition is diagnosed based on a pelvic exam and medical history. A pelvic exam can reveal signs of  infection, inflammation, and discharge in the vagina and the surrounding tissues. It can also help to identify painful areas. You may also have tests, such as:  Lab tests, including a pregnancy test, blood tests, and a urine test.  Culture tests of the vagina and cervix to check for an STI.  Ultrasound.  A laparoscopic procedure to look inside the pelvis.  Examination of vaginal discharge under a microscope. How is this treated? This condition may be treated with:  Antibiotic medicines taken by mouth (orally). For more severe cases, antibiotics may be given through an IV at the hospital.  Surgery. This is rare. Surgery may be needed if other treatments do not help.  Efforts to stop the spread of the infection. Sexual partners may need to be treated if the infection is caused by an STI. It may take weeks until you are completely well. If you are diagnosed with PID, you should also be checked for HIV (human immunodeficiency virus). Your health care provider may test you for infection again 3 months after treatment. You should not have unprotected sex. Follow these instructions at home:  Take over-the-counter and prescription medicines only as told by your health care provider.  If you were prescribed an antibiotic medicine, take it as told by your health care provider. Do not stop using the antibiotic even if you start to feel better.  Do not have sex until treatment is completed or as told by your health care provider. If PID is confirmed, your recent sexual partners will need treatment, especially if you had unprotected sex.  Keep all   follow-up visits as told by your health care provider. This is important. Contact a health care provider if:  You have increased or abnormal vaginal discharge.  Your pain does not improve.  You vomit.  You have a fever.  You cannot tolerate your medicines.  Your partner has an STI.  You have pain when you urinate. Get help right away  if:  You have increased abdominal or pelvic pain.  You have chills.  Your symptoms are not better in 72 hours with treatment. Summary  Pelvic inflammatory disease (PID) is caused by an infection in some or all of the female reproductive organs.  PID is a serious infection because it can lead to lasting (chronic) pelvic pain or the inability to have children (infertility).  This infection is usually treated with antibiotic medicines.  Do not have sex until treatment is completed or as told by your health care provider. This information is not intended to replace advice given to you by your health care provider. Make sure you discuss any questions you have with your health care provider. Document Revised: 12/19/2017 Document Reviewed: 12/24/2017 Elsevier Patient Education  2021 Elsevier Inc.  

## 2020-05-03 ENCOUNTER — Other Ambulatory Visit: Payer: Self-pay

## 2020-05-03 ENCOUNTER — Encounter: Payer: Self-pay | Admitting: Family Medicine

## 2020-05-03 ENCOUNTER — Other Ambulatory Visit (HOSPITAL_COMMUNITY)
Admission: RE | Admit: 2020-05-03 | Discharge: 2020-05-03 | Disposition: A | Discharge status: Home / Self Care | Payer: Commercial Managed Care - PPO | Source: Ambulatory Visit | Attending: Family Medicine | Admitting: Family Medicine

## 2020-05-03 ENCOUNTER — Ambulatory Visit: Payer: Commercial Managed Care - PPO | Admitting: Family Medicine

## 2020-05-03 VITALS — BP 131/88 | HR 71 | Ht 68.0 in | Wt 201.4 lb

## 2020-05-03 DIAGNOSIS — R109 Unspecified abdominal pain: Secondary | ICD-10-CM | POA: Insufficient documentation

## 2020-05-03 DIAGNOSIS — N7093 Salpingitis and oophoritis, unspecified: Secondary | ICD-10-CM | POA: Diagnosis not present

## 2020-05-03 DIAGNOSIS — L678 Other hair color and hair shaft abnormalities: Secondary | ICD-10-CM | POA: Diagnosis not present

## 2020-05-03 DIAGNOSIS — B1920 Unspecified viral hepatitis C without hepatic coma: Secondary | ICD-10-CM | POA: Insufficient documentation

## 2020-05-03 NOTE — Progress Notes (Unsigned)
   GYNECOLOGY PROBLEM  VISIT ENCOUNTER NOTE  Subjective:   Meredith Hansen is a 50 y.o. [phone] female here for a a problem GYN visit.    Current complaints:  Adbominal pain Swelling in legs  Denies constipation but hurts to have BM.   Denies abnormal vaginal bleeding, discharge, pelvic pain, problems with intercourse or other gynecologic concerns.    Gynecologic History Patient's last menstrual period was 03/08/2020 (exact date). Contraception: none  Health Maintenance Due  Topic Date Due  . COVID-19 Vaccine (1) Never done  . HIV Screening  Never done  . TETANUS/TDAP  Never done  . PAP SMEAR-Modifier  Never done  . COLONOSCOPY (Pts 45-36yrs Insurance coverage will need to be confirmed)  Never done  . INFLUENZA VACCINE  Never done     The following portions of the patient's history were reviewed and updated as appropriate: allergies, current medications, past family history, past medical history, past social history, past surgical history and problem list.  Review of Systems Pertinent items are noted in HPI.   Objective:  BP 131/88 (BP Location: Right Arm, Patient Position: Sitting, Cuff Size: Normal)   Pulse 71   Ht 5\' 8"  (1.727 m)   Wt 201 lb 6.4 oz (91.4 kg)   LMP 03/08/2020 (Exact Date)   BMI 30.62 kg/m  Gen: well appearing, NAD HEENT: no scleral icterus CV: RR Lung: Normal WOB Ext: warm well perfused  PELVIC: Normal appearing external genitalia; normal appearing vaginal mucosa and cervix.  No abnormal discharge noted.  Pap smear obtained.  Normal uterine size, no other palpable masses, no uterine or adnexal tenderness.   Assessment and Plan:   1. Tubo-ovarian abscess -continue antibiotics -03/10/2020 PELVIS TRANSVAGINAL NON-OB (TV ONLY); Future  2. Abdominal pain, unspecified abdominal location - Has not had pap in "years" - Cytology - PAP  3. Brittle hair - TSH  4. 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   Return in about 3 months (around 08/01/2020).  08/03/2020, MD, MPH, ABFM Attending Physician Faculty Practice- [facility] for [facility]

## 2020-05-04 LAB — TSH: TSH: 3.07 u[IU]/mL (ref 0.450–4.500)

## 2020-05-05 ENCOUNTER — Telehealth: Payer: Self-pay | Admitting: *Deleted

## 2020-05-05 ENCOUNTER — Telehealth: Payer: Self-pay | Admitting: Family Medicine

## 2020-05-05 LAB — CYTOLOGY - PAP
Comment: NEGATIVE
Diagnosis: NEGATIVE
High risk HPV: NEGATIVE

## 2020-05-05 NOTE — Telephone Encounter (Signed)
LMOVM TSH normal per Dr Alvester Morin. Advised to call back if further questions or concerns.

## 2020-05-05 NOTE — Telephone Encounter (Signed)
Pt has had both her pfizer vaccines, but did not mention if she had the booster.

## 2020-06-06 ENCOUNTER — Other Ambulatory Visit: Payer: Self-pay

## 2020-06-06 ENCOUNTER — Ambulatory Visit (INDEPENDENT_AMBULATORY_CARE_PROVIDER_SITE_OTHER): Payer: Commercial Managed Care - PPO

## 2020-06-06 ENCOUNTER — Other Ambulatory Visit: Payer: Self-pay | Admitting: Family Medicine

## 2020-06-06 DIAGNOSIS — N7093 Salpingitis and oophoritis, unspecified: Secondary | ICD-10-CM

## 2020-06-06 NOTE — Progress Notes (Signed)
PELVIC US TV only per Dr Newton,heterogeneous anteverted uterus,EEC 8.3 mm,bilat hydrosalpinx,normal left ovary,simple fluid filled tubular structure in the left adnexa 5.5 x 1.6 cm,normal right ovary with a 5.3 x 1.8 cm simple fluid filled tubular structure in the right adnexa,no free fluid,no pain during ultrasound  Chaperone Peggy

## 2020-08-02 ENCOUNTER — Ambulatory Visit: Payer: Commercial Managed Care - PPO | Admitting: Obstetrics & Gynecology

## 2021-01-16 ENCOUNTER — Encounter: Payer: Self-pay | Admitting: Adult Health

## 2021-01-16 ENCOUNTER — Other Ambulatory Visit: Payer: Self-pay

## 2021-01-16 ENCOUNTER — Telehealth (INDEPENDENT_AMBULATORY_CARE_PROVIDER_SITE_OTHER): Payer: Commercial Managed Care - PPO | Admitting: Adult Health

## 2021-01-16 DIAGNOSIS — Z30011 Encounter for initial prescription of contraceptive pills: Secondary | ICD-10-CM | POA: Insufficient documentation

## 2021-01-16 MED ORDER — NORETHINDRONE 0.35 MG PO TABS: 1.0000 | ORAL_TABLET | Freq: Every day | ORAL | 11 refills | Status: DC

## 2021-01-16 NOTE — Progress Notes (Signed)
Patient ID: Meredith Hansen, female   DOB: 1970-07-04, 50 y.o.   MRN: [id number]   TELEHEALTH GYNECOLOGY VISIT ENCOUNTER NOTE  [facility]   Patient location: Home  I connected with Sonia Baller on 01/16/21 at  2:10 PM EDT by telephone and verified that I am speaking with the correct person using two identifiers. Patient was unable to do MyChart audiovisual encounter due to technical difficulties, she tried several times.    I discussed the limitations, risks, security and privacy concerns of performing an evaluation and management service by telephone and the availability of in person appointments. I also discussed with the patient that there may be a patient responsible charge related to this service. The patient expressed understanding and agreed to proceed.   History:  Meredith Hansen is a 50 y.o. [phone] female being evaluated today for birth control, she is a smoker.Periods can be irregular,and she does not want to gain weight, does not want IUD. She denies any abnormal vaginal discharge, bleeding, pelvic pain or other concerns.    PCP is Marylynn Pearson, NP.   Past Medical History:  Diagnosis Date   GERD (gastroesophageal reflux disease)    Past Surgical History:  Procedure Laterality Date   CERVICAL DISCECTOMY  2018   C1-C3   CESAREAN SECTION  1996, 1998, 2004   liposuction, tummy tuck  11/2019   The following portions of the patient's history were reviewed and updated as appropriate: allergies, current medications, past family history, past medical history, past social history, past surgical history and problem list.   Health Maintenance:  Normal pap and negative HRHPV on 05/03/20.  Normal mammogram not on file.  Review of Systems:  Pertinent items noted in HPI and remainder of comprehensive ROS otherwise negative.  Physical Exam:   General:  Alert, oriented and cooperative.   Mental Status: Normal mood and affect  perceived. Normal judgment and thought content.  Physical exam deferred due to nature of the encounter     [facility] - 01/16/21 1421       Pregnancy Intention Screening   Does the patient want to become pregnant in the next year? No    Does the patient's partner want to become pregnant in the next year? No    Would the patient like to discuss contraceptive options today? Yes      Contraception Wrap Up   Current Method Female Condom    End Method Oral Contraceptive;Female Condom   micronor   Contraception Counseling Provided Yes             Labs and Imaging No results found for this or any previous visit (from the past 336 hour(s)). No results found.    Assessment and Plan:     1. Encounter for initial prescription of contraceptive pills Will rx Micronor, cn start on first day next period and use condoms for 1 pack and take same time daily Follow up in 3 months       I discussed the assessment and treatment plan with the patient. The patient was provided an opportunity to ask questions and all were answered. The patient agreed with the plan and demonstrated an understanding of the instructions.   The patient was advised to call back or seek an in-person evaluation/go to the ED if the symptoms worsen or if the condition fails to improve as anticipated.  I provided 10 minutes of non-face-to-face time during this encounter. I was in my office at [facility]  tree during this encounter   Cyril Mourning, NP Center for [employer], [facility]

## 2021-05-15 ENCOUNTER — Ambulatory Visit
Admission: EM | Admit: 2021-05-15 | Discharge: 2021-05-15 | Disposition: A | Discharge status: Home / Self Care | Payer: Commercial Managed Care - PPO | Attending: Urgent Care | Admitting: Urgent Care

## 2021-05-15 ENCOUNTER — Other Ambulatory Visit: Payer: Self-pay

## 2021-05-15 DIAGNOSIS — F17200 Nicotine dependence, unspecified, uncomplicated: Secondary | ICD-10-CM | POA: Insufficient documentation

## 2021-05-15 DIAGNOSIS — J4530 Mild persistent asthma, uncomplicated: Secondary | ICD-10-CM | POA: Diagnosis not present

## 2021-05-15 DIAGNOSIS — J069 Acute upper respiratory infection, unspecified: Secondary | ICD-10-CM | POA: Diagnosis not present

## 2021-05-15 DIAGNOSIS — R070 Pain in throat: Secondary | ICD-10-CM | POA: Insufficient documentation

## 2021-05-15 DIAGNOSIS — R52 Pain, unspecified: Secondary | ICD-10-CM | POA: Insufficient documentation

## 2021-05-15 LAB — POCT RAPID STREP A (OFFICE): Rapid Strep A Screen: NEGATIVE

## 2021-05-15 MED ORDER — PROMETHAZINE-DM 6.25-15 MG/5ML PO SYRP: 5.0000 mL | ORAL_SOLUTION | Freq: Every evening | ORAL | 0 refills | Status: DC | PRN

## 2021-05-15 MED ORDER — LEVOCETIRIZINE DIHYDROCHLORIDE 5 MG PO TABS: 5.0000 mg | ORAL_TABLET | Freq: Every evening | ORAL | 0 refills | Status: DC

## 2021-05-15 MED ORDER — BENZONATATE 100 MG PO CAPS: 100.0000 mg | ORAL_CAPSULE | Freq: Three times a day (TID) | ORAL | 0 refills | Status: DC | PRN

## 2021-05-15 MED ORDER — ALBUTEROL SULFATE HFA 108 (90 BASE) MCG/ACT IN AERS: 1.0000 | INHALATION_SPRAY | Freq: Four times a day (QID) | RESPIRATORY_TRACT | 0 refills | Status: DC | PRN

## 2021-05-15 MED ORDER — PREDNISONE 50 MG PO TABS: 50.0000 mg | ORAL_TABLET | Freq: Every day | ORAL | 0 refills | Status: DC

## 2021-05-15 NOTE — Discharge Instructions (Signed)
We will notify you of your test results as they arrive and may take between 48-72 hours.  I encourage you to sign up for MyChart if you have not already done so as this can be the easiest way for [place] to communicate results to you online or through a phone app.  Generally, we only contact you if it is a positive test result.  In the meantime, if you develop worsening symptoms including fever, chest pain, shortness of breath despite our current treatment plan then please report to the emergency room as this may be a sign of worsening status from possible viral infection.  Otherwise, we will manage this as a viral syndrome. For sore throat or cough try using a honey-based tea. Use 3 teaspoons of honey with juice squeezed from half lemon. Place shaved pieces of ginger into 1/2-1 cup of water and warm over stove top. Then mix the ingredients and repeat every 4 hours as needed. Please take Tylenol 500mg -650mg  every 6 hours for aches and pains, fevers. Hydrate very well with at least 2 liters of water. Eat light meals such as soups to replenish electrolytes and soft fruits, veggies. Start an antihistamine like Zyrtec for postnasal drainage, sinus congestion.  You can take this together with the prednisone, a steroid. Use the cough medications and albuterol inhaler as needed.

## 2021-05-15 NOTE — ED Triage Notes (Signed)
Pt reports cough, fever 102.0 F, sore throat, hoarse, body aches x 3 days.

## 2021-05-15 NOTE — ED Provider Notes (Signed)
[facility]   MRN: [id number] DOB: 03-31-1971  Subjective:   Meredith Hansen is a 51 y.o. female presenting for 3-day history of acute onset fevers, coughing, throat pain, hoarseness, body aches, shortness of breath and wheezing.  Patient has a history of asthma, needs an inhaler refill.  She also smokes half pack per day.  She is not opposed to COVID-19 testing.  Admits that she actually uses oxymetazoline nasal spray multiple times daily and has done this for about 10 years.  No current facility-administered medications for this encounter.  Current Outpatient Medications:    norethindrone (MICRONOR) 0.35 MG tablet, Take 1 tablet (0.35 mg total) by mouth daily., Disp: 28 tablet, Rfl: 11   omeprazole (PRILOSEC) 40 MG capsule, Take 40 mg by mouth daily., Disp: , Rfl:    No Known Allergies  Past Medical History:  Diagnosis Date   GERD (gastroesophageal reflux disease)      Past Surgical History:  Procedure Laterality Date   CERVICAL DISCECTOMY  2018   C1-C3   CESAREAN SECTION  1996, 1998, 2004   liposuction, tummy tuck  11/2019    Family History  Problem Relation Age of Onset   Dementia Maternal Grandmother    Dementia Maternal Grandfather    Stroke Father    Dementia Mother    Heart disease Mother    Diabetes Mother    Heart murmur Mother    Parkinson's disease Mother    Stroke Brother     Social History   Tobacco Use   Smoking status: Every Day    Packs/day: 0.25    Types: Cigarettes   Smokeless tobacco: Never  Vaping Use   Vaping Use: Every day  Substance Use Topics   Alcohol use: Never   Drug use: Never    ROS   Objective:   Vitals: BP 122/81 (BP Location: Right Arm)    Pulse 95    Temp 98.2 F (36.8 C) (Oral)    Resp 18    SpO2 95%   Physical Exam Constitutional:      General: She is not in acute distress.    Appearance: Normal appearance. She is well-developed and normal weight. She is not ill-appearing, toxic-appearing or  diaphoretic.  HENT:     Head: Normocephalic and atraumatic.     Right Ear: Tympanic membrane, ear canal and external ear normal. No drainage or tenderness. No middle ear effusion. There is no impacted cerumen. Tympanic membrane is not erythematous.     Left Ear: Tympanic membrane, ear canal and external ear normal. No drainage or tenderness.  No middle ear effusion. There is no impacted cerumen. Tympanic membrane is not erythematous.     Nose: Congestion and rhinorrhea present.     Mouth/Throat:     Mouth: Mucous membranes are moist. No oral lesions.     Pharynx: No pharyngeal swelling, oropharyngeal exudate, posterior oropharyngeal erythema or uvula swelling.     Tonsils: No tonsillar exudate or tonsillar abscesses.  Eyes:     General: No scleral icterus.       Right eye: No discharge.        Left eye: No discharge.     Extraocular Movements: Extraocular movements intact.     Right eye: Normal extraocular motion.     Left eye: Normal extraocular motion.     Conjunctiva/sclera: Conjunctivae normal.  Cardiovascular:     Rate and Rhythm: Normal rate.     Heart sounds: No murmur heard.   No friction rub.  No gallop.  Pulmonary:     Effort: Pulmonary effort is normal. No respiratory distress.     Breath sounds: No stridor. No wheezing, rhonchi or rales.  Chest:     Chest wall: No tenderness.  Musculoskeletal:     Cervical back: Normal range of motion and neck supple.  Lymphadenopathy:     Cervical: No cervical adenopathy.  Skin:    General: Skin is warm and dry.  Neurological:     General: No focal deficit present.     Mental Status: She is alert and oriented to person, place, and time.  Psychiatric:        Mood and Affect: Mood normal.        Behavior: Behavior normal.        Thought Content: Thought content normal.        Judgment: Judgment normal.    Results for orders placed or performed during the hospital encounter of 05/15/21 (from the past 24 hour(s))  POCT rapid strep  A     Status: None   Collection Time: 05/15/21 11:44 AM  Result Value Ref Range   Rapid Strep A Screen Negative Negative    Assessment and Plan :   PDMP not reviewed this encounter.  1. Viral URI with cough   2. Mild persistent asthma without complication   3. Smoker   4. Body aches   5. Throat pain    Deferred imaging given clear cardiopulmonary exam, hemodynamically stable vital signs.  In the context of her smoking and using oxymetazoline daily recommended an oral prednisone course to help with her respiratory symptoms and sinus congestion.  Use supportive care otherwise for viral respiratory illness.  COVID-19 testing pending. Counseled patient on potential for adverse effects with medications prescribed/recommended today, ER and return-to-clinic precautions discussed, patient verbalized understanding.    Wallis Bamberg, [place] 05/15/21 1211

## 2021-05-16 LAB — COVID-19, FLU A+B NAA
Influenza A, NAA: NOT DETECTED
Influenza B, NAA: NOT DETECTED
SARS-CoV-2, NAA: NOT DETECTED

## 2021-05-18 LAB — CULTURE, GROUP A STREP (THRC)

## 2021-11-04 IMAGING — CT CT ABD-PELV W/ CM
2 of 5 series · 15 of 46 positions shown, 17 images · IV contrast (Omnipaque or Isovue)
Comparison: None.

CLINICAL DATA: Acute nonlocalized abdominal pain.  Fever.

EXAM:
CT ABDOMEN AND PELVIS WITH CONTRAST
TECHNIQUE: Multidetector CT imaging of the abdomen and pelvis was performed
using the standard protocol following bolus administration of
intravenous contrast.
CONTRAST:  100mL OMNIPAQUE IOHEXOL 300 MG/ML  SOLN

[Series 2: axial st · axial · 0.90mm/px · z∈[-749,-279]mm · 12 of 106 slices shown, 14 images]
[im 6/106  soft-tissue]
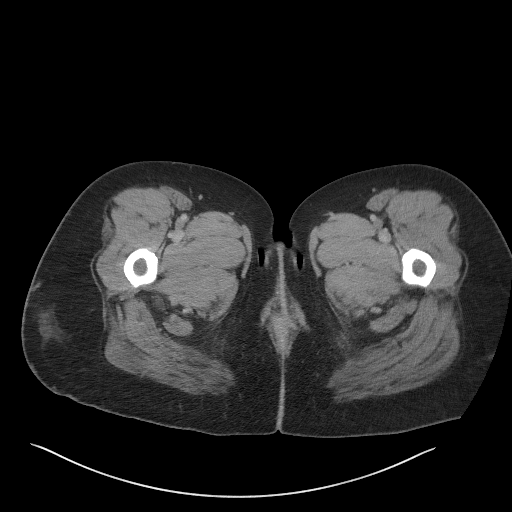
[im 6/106  bone]
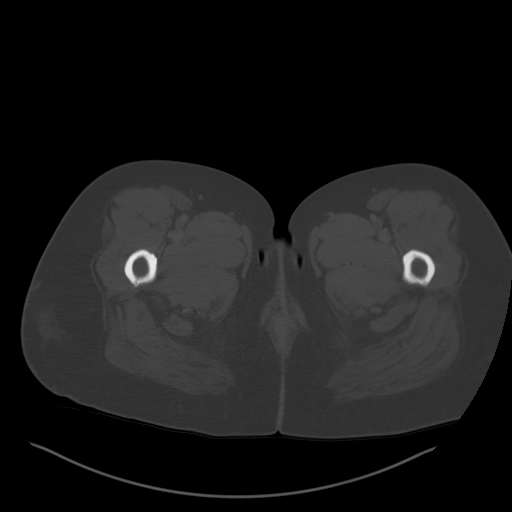
[im 18/106  soft-tissue]
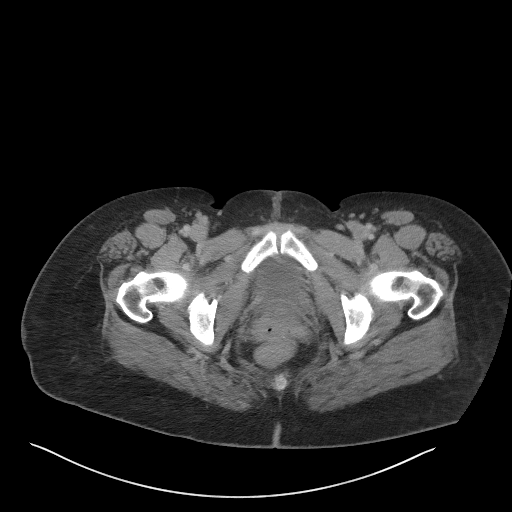
[im 24/106  soft-tissue]
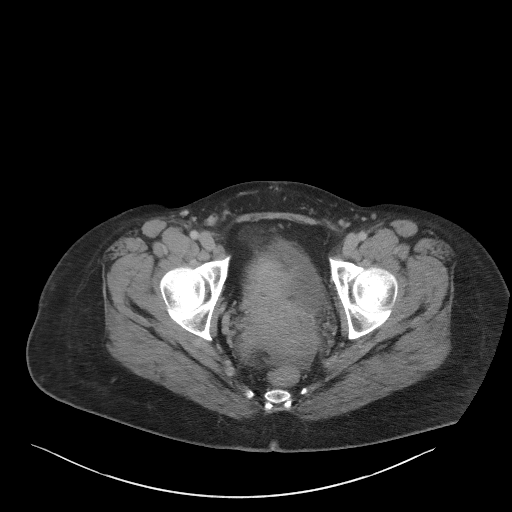
[im 30/106  soft-tissue]
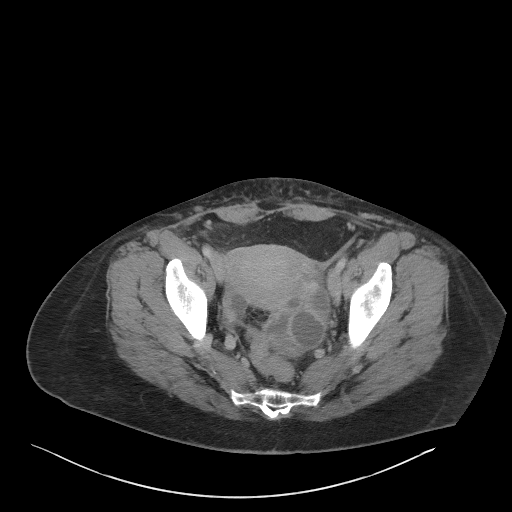
[im 41/106  soft-tissue]
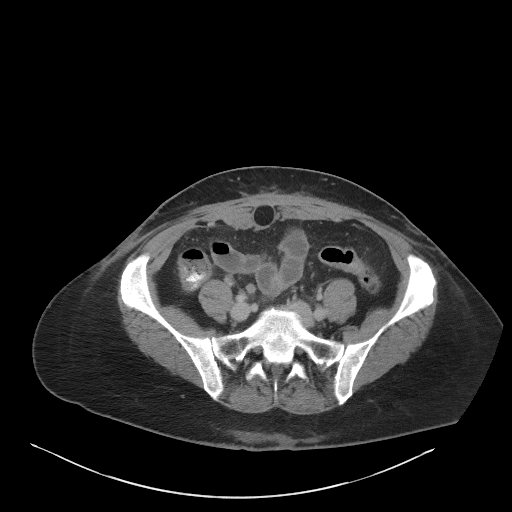
[im 47/106  soft-tissue]
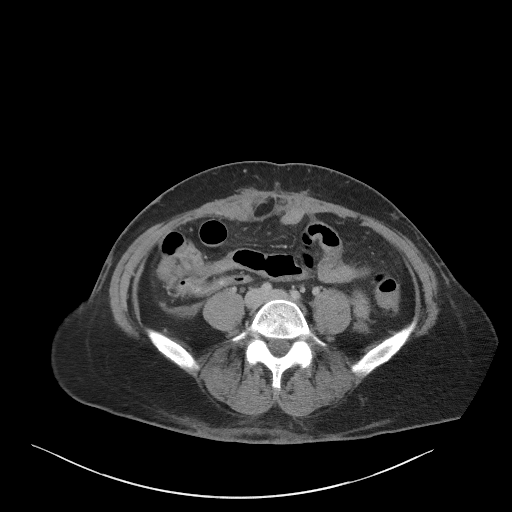
[im 59/106  soft-tissue]
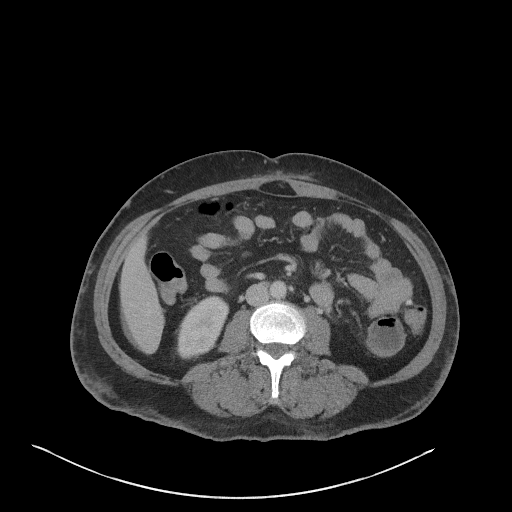
[im 65/106  soft-tissue]
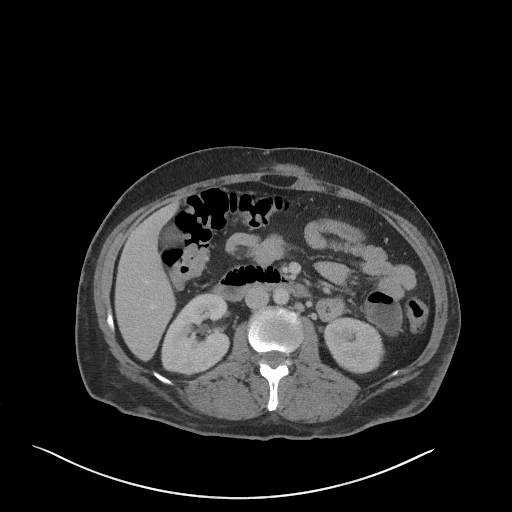
[im 76/106  soft-tissue]
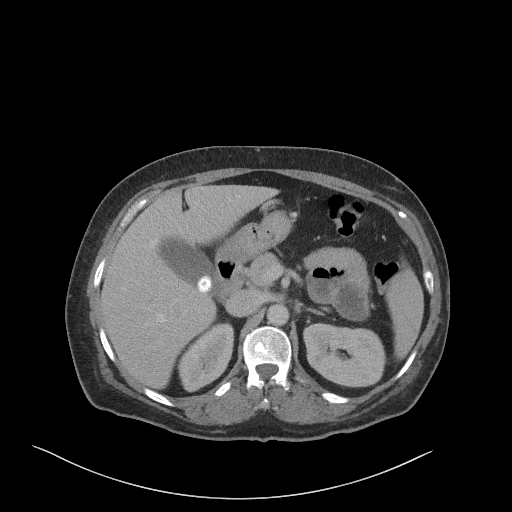
[im 76/106  bone]
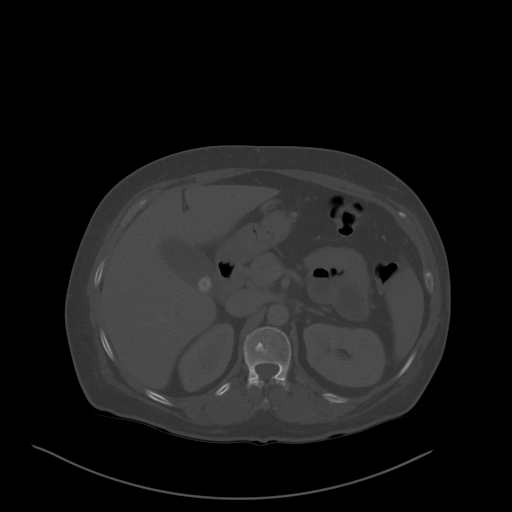
[im 82/106  soft-tissue]
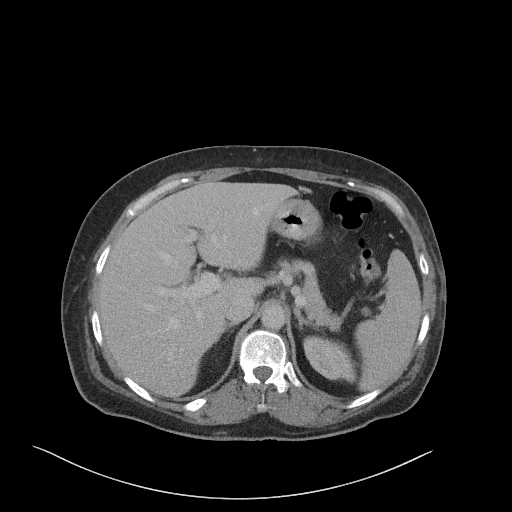
[im 88/106  soft-tissue]
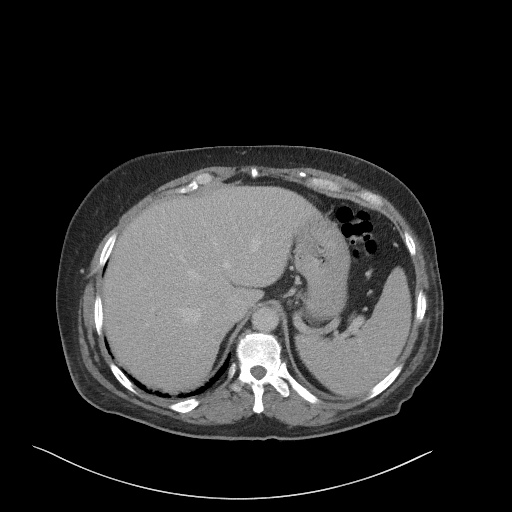
[im 100/106  soft-tissue]
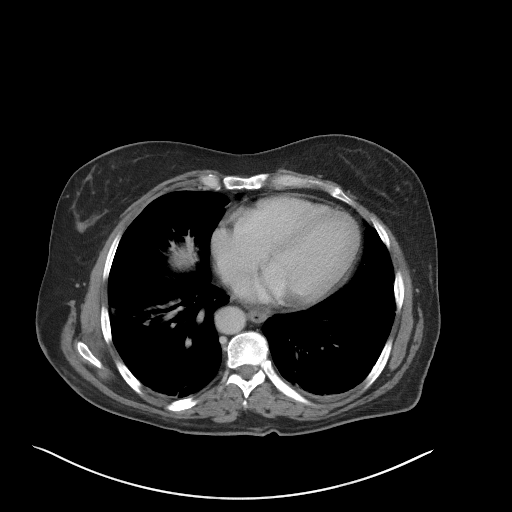

[Series 5: coronal st · coronal · 0.85mm/px · 3 of 100 slices shown]
[im 34/100  soft-tissue]
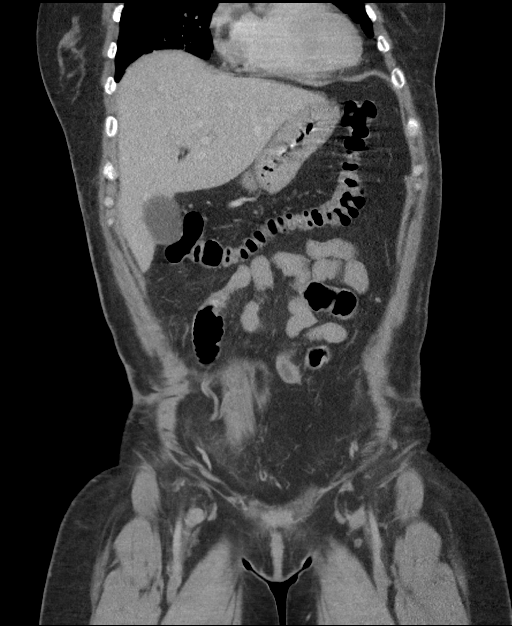
[im 45/100  soft-tissue]
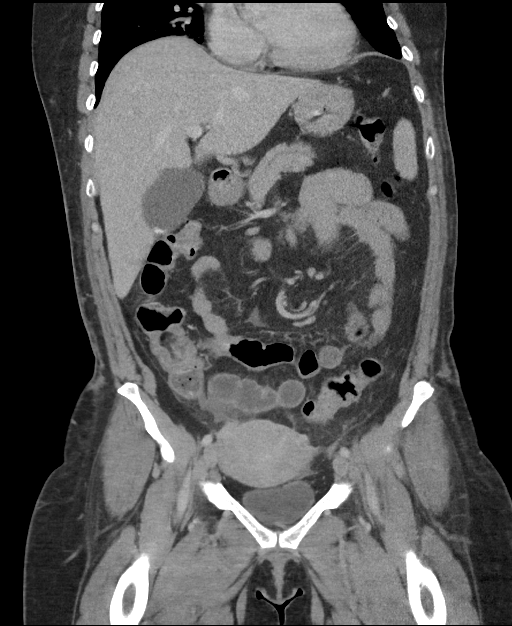
[im 56/100  soft-tissue]
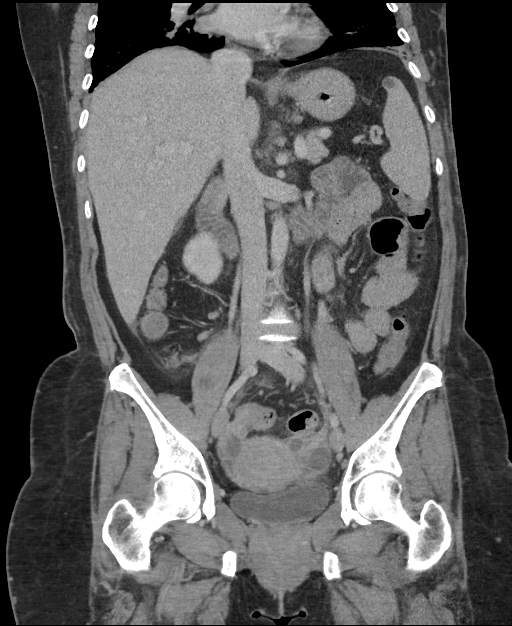

[15 of 46 positions shown; findings below may reference images not displayed]

FINDINGS: Lower chest: Included descending thoracic aorta is on the right, no
prior chest imaging, but possible right-sided aortic arch. Trace
pleural thickening in the lower lobes with scattered atelectasis.

Hepatobiliary: No focal hepatic abnormality. Mild gallbladder
distension. Small calcified gallstones in the gallbladder fundus,
with a 14 mm stellate stone in the more proximal gallbladder. No
gallbladder wall thickening or pericholecystic inflammation. No
biliary dilatation.

Pancreas: No ductal dilatation or inflammation.

Spleen: Borderline splenomegaly with spleen spanning 13.6 cm cranial
caudal. No focal abnormality.

Adrenals/Urinary Tract: Normal adrenal glands. No hydronephrosis. No
perinephric edema. There is a punctate nonobstructing stone in the
lower left kidney. Symmetric renal excretion on delayed phase
imaging. The urinary bladder is minimally distended. There is no
bladder wall thickening.

Stomach/Bowel: Decompressed stomach. Normal positioning of the
duodenum and ligament of Treitz. Occasional fluid-filled small bowel
loops without abnormal distension or obstruction. Appendix is upper
normal in size, filled with air and small amount of intraluminal
contrast. No evidence of appendicitis. Occasional colonic
diverticula of the descending colon. No acute diverticulitis.

Vascular/Lymphatic: Normal caliber abdominal aorta. Patent portal
vein. Multiple prominent retroperitoneal lymph nodes, not enlarged
by size criteria and typically reactive.

Reproductive: Thick-walled tubular structure in the left greater
than right adnexa suspicious for tubo-ovarian abscess. There is
generalized fat stranding of the pelvic fat. Small amount of free
fluid tracks into the pericolic gutters and adnexa. Uterus is
slightly heterogeneous.

Other: Abdominoplasty.  No free air.  Trace perihepatic ascites.

Musculoskeletal: There are no acute or suspicious osseous
abnormalities.
IMPRESSION: 1. Thick-walled tubular structure in the left greater than right
adnexa suspicious for tubo-ovarian abscess. There is generalized
edema and fat stranding of the intrapelvic fat. Small amount of free
fluid tracks into the pericolic gutters and adnexa.
2. Cholelithiasis without gallbladder inflammation.
3. Nonobstructing left nephrolithiasis.
4. Mild colonic diverticulosis without diverticulitis.
5. Included descending thoracic aorta is on the right, no prior
chest imaging, but possible right-sided aortic arch.

## 2022-01-25 ENCOUNTER — Telehealth (HOSPITAL_COMMUNITY): Payer: Self-pay

## 2022-01-25 NOTE — Telephone Encounter (Signed)
Called Meredith Hansen to schedule appt with Dr Nehemiah Settle she states that she no longer needs the referral at this time

## 2022-05-15 ENCOUNTER — Ambulatory Visit
Admission: EM | Admit: 2022-05-15 | Discharge: 2022-05-15 | Disposition: A | Discharge status: Home / Self Care | Payer: Commercial Managed Care - PPO

## 2022-05-15 DIAGNOSIS — J0120 Acute ethmoidal sinusitis, unspecified: Secondary | ICD-10-CM

## 2022-05-15 MED ORDER — AMOXICILLIN-POT CLAVULANATE 875-125 MG PO TABS: 1.0000 | ORAL_TABLET | Freq: Two times a day (BID) | ORAL | 0 refills | Status: DC

## 2022-05-15 NOTE — ED Provider Notes (Signed)
[facility]    CSN: [id number] Arrival date & time: 05/15/22  1610      History   Chief Complaint Chief Complaint  Patient presents with   Cough    HPI Meredith Hansen is a 52 y.o. female.   Patient reports she has had a cough and congestion for the past 3 weeks patient reports it started with respiratory symptoms however now she has pain in the right side of her face.  Patient reports right face feels swollen and puffy.  Patient reports she has chronic congestion but she normally does not have pain in this area patient reports pain radiates to her mouth and teeth  The history is provided by the patient.  Cough Cough characteristics:  Non-productive Timing:  Constant Progression:  Worsening Chronicity:  New   Past Medical History:  Diagnosis Date   GERD (gastroesophageal reflux disease)     Patient Active Problem List   Diagnosis Date Noted   Encounter for initial prescription of contraceptive pills 01/16/2021   Hepatitis C virus infection 05/03/2020   Tubo-ovarian abscess 04/23/2020    Past Surgical History:  Procedure Laterality Date   CERVICAL DISCECTOMY  2018   C1-C3   CESAREAN SECTION  1996, 1998, 2004   liposuction, tummy tuck  11/2019    OB History     Gravida  4   Para  3   Term  3   Preterm      AB  1   Living  3      SAB  1   IAB      Ectopic      Multiple      Live Births  3            Home Medications    Prior to Admission medications   Medication Sig Start Date End Date Taking? Authorizing Provider  amoxicillin-clavulanate (AUGMENTIN) 875-125 MG tablet Take 1 tablet by mouth 2 (two) times daily. 05/15/22  Yes Caryl Ada K, PA-C  FLUoxetine (PROZAC) 20 MG capsule Take 20 mg by mouth daily. 04/20/22  Yes [provider]  OLANZapine (ZYPREXA) 10 MG tablet SMARTSIG:1 Tablet(s) By Mouth Every Evening 04/20/22  Yes [provider]  topiramate (TOPAMAX) 50 MG tablet Take 50 mg by mouth at bedtime.  02/20/22  Yes [provider]  traZODone (DESYREL) 150 MG tablet Take 150 mg by mouth at bedtime as needed. 02/20/22  Yes [provider]  albuterol (VENTOLIN HFA) 108 (90 Base) MCG/ACT inhaler Inhale 1-2 puffs into the lungs every 6 (six) hours as needed for wheezing or shortness of breath. 05/15/21   Jaynee Eagles, PA-C  benzonatate (TESSALON) 100 MG capsule Take 1-2 capsules (100-200 mg total) by mouth 3 (three) times daily as needed for cough. 05/15/21   Jaynee Eagles, PA-C  levocetirizine (XYZAL) 5 MG tablet Take 1 tablet (5 mg total) by mouth every evening. 05/15/21   Jaynee Eagles, PA-C  norethindrone (MICRONOR) 0.35 MG tablet Take 1 tablet (0.35 mg total) by mouth daily. 01/16/21   Estill Dooms, NP  omeprazole (PRILOSEC) 40 MG capsule Take 40 mg by mouth daily.    [provider]  predniSONE (DELTASONE) 50 MG tablet Take 1 tablet (50 mg total) by mouth daily with breakfast. 05/15/21   Jaynee Eagles, PA-C  promethazine-dextromethorphan (PROMETHAZINE-DM) 6.25-15 MG/5ML syrup Take 5 mLs by mouth at bedtime as needed for cough. 05/15/21   Jaynee Eagles, PA-C    Family History Family History  Problem Relation  Age of Onset   Dementia Maternal Grandmother    Dementia Maternal Grandfather    Stroke Father    Dementia Mother    Heart disease Mother    Diabetes Mother    Heart murmur Mother    Parkinson's disease Mother    Stroke Brother     Social History Social History   Tobacco Use   Smoking status: Every Day    Packs/day: 0.25    Types: Cigarettes   Smokeless tobacco: Never  Vaping Use   Vaping Use: Every day  Substance Use Topics   Alcohol use: Never   Drug use: Never     Allergies   Patient has no known allergies.   Review of Systems Review of Systems  Respiratory:  Positive for cough.   All other systems reviewed and are negative.    Physical Exam Triage Vital Signs ED Triage Vitals  Enc Vitals Group     BP 05/15/22 1648 (!) 137/93      Pulse Rate 05/15/22 1648 76     Resp 05/15/22 1648 18     Temp 05/15/22 1648 (!) 97.5 F (36.4 C)     Temp Source 05/15/22 1648 Oral     SpO2 05/15/22 1648 95 %     Weight --      Height --      Head Circumference --      Peak Flow --      Pain Score 05/15/22 1650 8     Pain Loc --      Pain Edu? --      Excl. in [place]? --    No data found.  Updated Vital Signs BP (!) 137/93 (BP Location: Right Arm)   Pulse 76   Temp (!) 97.5 F (36.4 C) (Oral)   Resp 18   LMP  (Within Months) Comment: 2 months  SpO2 95%   Visual Acuity Right Eye Distance:   Left Eye Distance:   Bilateral Distance:    Right Eye Near:   Left Eye Near:    Bilateral Near:     Physical Exam Vitals and nursing note reviewed.  Constitutional:      Appearance: She is well-developed.  HENT:     Head: Normocephalic.     Comments: Tender right maxillary sinus Pulmonary:     Effort: Pulmonary effort is normal.  Abdominal:     General: There is no distension.  Musculoskeletal:        General: Normal range of motion.     Cervical back: Normal range of motion.  Neurological:     Mental Status: She is alert and oriented to person, place, and time.      UC Treatments / Results  Labs (all labs ordered are listed, but only abnormal results are displayed) Labs Reviewed - No data to display  EKG   Radiology No results found.  Procedures Procedures (including critical care time)  Medications Ordered in UC Medications - No data to display  Initial Impression / Assessment and Plan / UC Course  I have reviewed the triage vital signs and the nursing notes.  Pertinent labs & imaging results that were available during my care of the patient were reviewed by me and considered in my medical decision making (see chart for details).     MDM: I think it is reasonable to treat patient for a sinus infection she has had sinusitis symptoms for the past 3 weeks she is given a prescription for Augmentin patient  is advised to follow-up with her primary care physician for recheck if symptoms persist Final Clinical Impressions(s) / UC Diagnoses   Final diagnoses:  Acute non-recurrent ethmoidal sinusitis     Discharge Instructions      Return if any problems.    ED Prescriptions     Medication Sig Dispense Auth. Provider   amoxicillin-clavulanate (AUGMENTIN) 875-125 MG tablet Take 1 tablet by mouth 2 (two) times daily. 20 tablet Fransico Meadow, [place]      PDMP not reviewed this encounter. An After Visit Summary was printed and given to the patient.       Fransico Meadow, [place] 05/15/22 1733

## 2022-05-15 NOTE — ED Triage Notes (Signed)
Pt reports right sided facial swelling, right sided neck pain, congestion and cough x 3 weeks; facial .

## 2022-05-15 NOTE — Discharge Instructions (Addendum)
Return if any problems.

## 2022-11-20 ENCOUNTER — Encounter (HOSPITAL_COMMUNITY): Payer: Self-pay | Admitting: Emergency Medicine

## 2022-11-20 ENCOUNTER — Encounter (HOSPITAL_COMMUNITY): Payer: Self-pay

## 2022-11-20 ENCOUNTER — Other Ambulatory Visit: Payer: Self-pay

## 2022-11-20 ENCOUNTER — Emergency Department (HOSPITAL_COMMUNITY)
Admission: EM | Admit: 2022-11-20 | Discharge: 2022-11-20 | Discharge status: Against Medical Advice | Payer: Self-pay | Attending: Emergency Medicine | Admitting: Emergency Medicine

## 2022-11-20 DIAGNOSIS — Z5321 Procedure and treatment not carried out due to patient leaving prior to being seen by health care provider: Secondary | ICD-10-CM | POA: Insufficient documentation

## 2022-11-20 DIAGNOSIS — Z136 Encounter for screening for cardiovascular disorders: Secondary | ICD-10-CM | POA: Insufficient documentation

## 2022-11-20 NOTE — ED Notes (Signed)
Pt stated she had to work in am, and couldn't stay to be evaluated by ED Dr.

## 2022-11-20 NOTE — ED Triage Notes (Signed)
Pt sent to ED by PCP for EKG before being put on weight loss medicine.

## 2023-08-20 ENCOUNTER — Other Ambulatory Visit (HOSPITAL_COMMUNITY): Payer: Self-pay | Admitting: Family Medicine

## 2023-08-20 DIAGNOSIS — Z1231 Encounter for screening mammogram for malignant neoplasm of breast: Secondary | ICD-10-CM

## 2023-08-21 ENCOUNTER — Encounter: Payer: Self-pay | Admitting: *Deleted

## 2023-08-26 ENCOUNTER — Encounter (HOSPITAL_COMMUNITY): Payer: Self-pay

## 2023-09-02 ENCOUNTER — Encounter (HOSPITAL_COMMUNITY): Payer: Self-pay

## 2023-09-02 ENCOUNTER — Ambulatory Visit (HOSPITAL_COMMUNITY)
Admission: RE | Admit: 2023-09-02 | Discharge: 2023-09-02 | Disposition: A | Discharge status: Home / Self Care | Payer: Self-pay | Source: Ambulatory Visit | Attending: Family Medicine | Admitting: Family Medicine

## 2023-09-02 DIAGNOSIS — Z1231 Encounter for screening mammogram for malignant neoplasm of breast: Secondary | ICD-10-CM | POA: Diagnosis present

## 2023-09-06 ENCOUNTER — Telehealth: Payer: Self-pay | Admitting: *Deleted

## 2023-09-06 NOTE — Telephone Encounter (Signed)
  Procedure: COLONOSCOPY  Height: 5'8 Weight: 205LBS        Have you had a colonoscopy before?  NO  Do you have family history of colon cancer?  NO  Do you have a family history of polyps? NO  Previous colonoscopy with polyps removed? NO  Do you have a history colorectal cancer?   NO  Are you diabetic?  NO  Do you have a prosthetic or mechanical heart valve? NO  Do you have a pacemaker/defibrillator?   NO  Have you had endocarditis/atrial fibrillation?  NO  Do you use supplemental oxygen/CPAP?  NO  Have you had joint replacement within the last 12 months?  NO  Do you tend to be constipated or have to use laxatives?  NO   Do you have history of alcohol use? If yes, how much and how often.  NO  Do you have history or are you using drugs? If yes, what do are you  using?  NO  Have you ever had a stroke/heart attack?  NO  Have you ever had a heart or other vascular stent placed,?NO  Do you take weight loss medication? YES  female patients,: have you had a hysterectomy? NO                              are you post menopausal?  NO                              do you still have your menstrual cycle?     Date of last menstrual period?   Do you take any blood-thinning medications such as: (Plavix, aspirin, Coumadin, Aggrenox, Brilinta, Xarelto, Eliquis, Pradaxa, Savaysa or Effient)? NO  If yes we need the name, milligram, dosage and who is prescribing doctor:               Current Outpatient Medications  Medication Sig Dispense Refill   phentermine (ADIPEX-P) 37.5 MG tablet Take 37.5 mg by mouth daily before breakfast.     traZODone (DESYREL) 100 MG tablet Take 2 tablets by mouth daily.     No current facility-administered medications for this visit.    No Known Allergies

## 2023-09-22 ENCOUNTER — Ambulatory Visit
Admission: EM | Admit: 2023-09-22 | Discharge: 2023-09-22 | Disposition: A | Discharge status: Home / Self Care | Attending: Nurse Practitioner | Admitting: Nurse Practitioner

## 2023-09-22 DIAGNOSIS — N3000 Acute cystitis without hematuria: Secondary | ICD-10-CM | POA: Diagnosis not present

## 2023-09-22 DIAGNOSIS — Z79899 Other long term (current) drug therapy: Secondary | ICD-10-CM | POA: Insufficient documentation

## 2023-09-22 LAB — POC COVID19/FLU A&B COMBO
Covid Antigen, POC: NEGATIVE
Influenza A Antigen, POC: NEGATIVE
Influenza B Antigen, POC: NEGATIVE

## 2023-09-22 LAB — POCT URINALYSIS DIP (MANUAL ENTRY)
Bilirubin, UA: NEGATIVE
Glucose, UA: NEGATIVE mg/dL
Ketones, POC UA: NEGATIVE mg/dL
Nitrite, UA: POSITIVE — AB
Spec Grav, UA: 1.025 (ref 1.010–1.025)
Urobilinogen, UA: 0.2 U/dL
pH, UA: 5.5 (ref 5.0–8.0)

## 2023-09-22 MED ORDER — SULFAMETHOXAZOLE-TRIMETHOPRIM 800-160 MG PO TABS: 1.0000 | ORAL_TABLET | Freq: Two times a day (BID) | ORAL | 0 refills | Status: AC

## 2023-09-22 NOTE — Discharge Instructions (Signed)
 Your COVID/flu test was negative.  Your urinalysis shows you have a urinary tract infection.  A urine culture has been ordered to ensure you are being treated with the appropriate antibiotic.  If the medication needs to be changed, you will be contacted.  You also have access to your results via MyChart. Take medication as prescribed. You may continue Tylenol  as needed for pain, fever, or general discomfort. Increase fluids and allow for plenty of rest.  Try to drink at least 8-10 8 ounce glasses of water daily. Develop a toileting schedule that will allow you to urinate at least every 2 hours. Avoid caffeine such as tea, soda, or coffee while symptoms persist. Monitor your symptoms for worsening.  If you continue to experience fever or lower abdominal pain despite being on medication, please go to the emergency department immediately for further evaluation. Follow-up as needed.

## 2023-09-22 NOTE — ED Provider Notes (Signed)
 [facility]    CSN: [id number] Arrival date & time: 09/22/23  0801      History   Chief Complaint No chief complaint on file.   HPI Meredith Hansen is a 53 y.o. female.   The history is provided by the patient.   Patient presents with a 2-day history of fever and abdominal soreness.  Tmax 102.  Patient reports that she is on Wegovy and has been known to have some constipation.  She states several days ago, she did take a laxative and had a good bowel movement which helped relieve some of her abdominal symptoms.  She denies chills, body aches, nasal congestion, runny nose, cough, nausea, vomiting, diarrhea, urinary frequency, urgency, hesitancy, flank pain, or low back pain.  Patient reports that she is currently on Wegovy and knows that some of her symptoms of constipation can be caused by that medication.  Patient also with underlying history of reflux.  States she has been taking Tylenol  for her symptoms.  Past Medical History:  Diagnosis Date   GERD (gastroesophageal reflux disease)     Patient Active Problem List   Diagnosis Date Noted   Encounter for initial prescription of contraceptive pills 01/16/2021   Hepatitis C virus infection 05/03/2020   Tubo-ovarian abscess 04/23/2020    Past Surgical History:  Procedure Laterality Date   CERVICAL DISCECTOMY  2018   C1-C3   CESAREAN SECTION  1996, 1998, 2004   liposuction, tummy tuck  11/2019    OB History     Gravida  4   Para  3   Term  3   Preterm      AB  1   Living  3      SAB  1   IAB      Ectopic      Multiple      Live Births  3            Home Medications    Prior to Admission medications   Medication Sig Start Date End Date Taking? Authorizing Provider  aspirin EC 81 MG tablet Take 81 mg by mouth daily. Swallow whole.   Yes [provider]  sulfamethoxazole-trimethoprim (BACTRIM DS) 800-160 MG tablet Take 1 tablet by mouth 2 (two) times daily for 7 days. 09/22/23  09/29/23 Yes Leath-Warren, Belen Bowers, NP  phentermine (ADIPEX-P) 37.5 MG tablet Take 37.5 mg by mouth daily before breakfast. 08/20/23   [provider]  traZODone (DESYREL) 100 MG tablet Take 2 tablets by mouth daily.    [provider]  WEGOVY 0.25 MG/0.5ML SOAJ INJECT 1 SYRINGE SUBCUTANEOUSLY ONCE A WEEK FOR  WEIGHT  LOSS    [provider]    Family History Family History  Problem Relation Age of Onset   Dementia Maternal Grandmother    Dementia Maternal Grandfather    Stroke Father    Dementia Mother    Heart disease Mother    Diabetes Mother    Heart murmur Mother    Parkinson's disease Mother    Stroke Brother     Social History Social History   Tobacco Use   Smoking status: Every Day    Current packs/day: 0.25    Types: Cigarettes   Smokeless tobacco: Never  Vaping Use   Vaping status: Every Day  Substance Use Topics   Alcohol use: Never   Drug use: Never     Allergies   Patient has no known allergies.   Review of Systems Review  of Systems Per HPI  Physical Exam Triage Vital Signs ED Triage Vitals  Encounter Vitals Group     BP 09/22/23 0813 111/79     Systolic BP Percentile --      Diastolic BP Percentile --      Pulse Rate 09/22/23 0813 (!) 114     Resp 09/22/23 0813 20     Temp 09/22/23 0813 98.3 F (36.8 C)     Temp Source 09/22/23 0813 Oral     SpO2 09/22/23 0813 93 %     Weight --      Height --      Head Circumference --      Peak Flow --      Pain Score 09/22/23 0814 5     Pain Loc --      Pain Education --      Exclude from Growth Chart --    No data found.  Updated Vital Signs BP 111/79   Pulse (!) 114   Temp 98.3 F (36.8 C) (Oral)   Resp 20   SpO2 93%   Visual Acuity Right Eye Distance:   Left Eye Distance:   Bilateral Distance:    Right Eye Near:   Left Eye Near:    Bilateral Near:     Physical Exam Vitals and nursing note reviewed.  Constitutional:      General: She is not in acute  distress.    Appearance: Normal appearance.  HENT:     Head: Normocephalic.     Mouth/Throat:     Mouth: Mucous membranes are moist.  Eyes:     Extraocular Movements: Extraocular movements intact.     Pupils: Pupils are equal, round, and reactive to light.  Cardiovascular:     Rate and Rhythm: Regular rhythm. Tachycardia present.     Pulses: Normal pulses.     Heart sounds: Normal heart sounds.  Pulmonary:     Effort: Pulmonary effort is normal. No respiratory distress.     Breath sounds: Normal breath sounds. No stridor. No wheezing, rhonchi or rales.  Abdominal:     General: Bowel sounds are normal.     Palpations: Abdomen is soft.     Tenderness: There is no abdominal tenderness. There is no right CVA tenderness or left CVA tenderness.  Musculoskeletal:     Cervical back: Normal range of motion.  Skin:    General: Skin is warm and dry.  Neurological:     General: No focal deficit present.     Mental Status: She is alert and oriented to person, place, and time.  Psychiatric:        Mood and Affect: Mood normal.        Behavior: Behavior normal.      UC Treatments / Results  Labs (all labs ordered are listed, but only abnormal results are displayed) Labs Reviewed  POCT URINALYSIS DIP (MANUAL ENTRY) - Abnormal; Notable for the following components:      Result Value   Clarity, UA hazy (*)    Blood, UA small (*)    Protein Ur, POC trace (*)    Nitrite, UA Positive (*)    Leukocytes, UA Trace (*)    All other components within normal limits  URINE CULTURE  POC COVID19/FLU A&B COMBO    EKG   Radiology No results found.  Procedures Procedures (including critical care time)  Medications Ordered in UC Medications - No data to display  Initial Impression / Assessment and Plan /  UC Course  I have reviewed the triage vital signs and the nursing notes.  Pertinent labs & imaging results that were available during my care of the patient were reviewed by me and  considered in my medical decision making (see chart for details).  Urinalysis positive for nitrites, blood, protein, and leukocytes, consistent with acute cystitis without hematuria.  Will start patient on Bactrim DS 800/160 mg due to fever and for risk of pyelonephritis.  Urine culture is pending.  Supportive care recommendations were provided and discussed with the patient to include continuing over-the-counter analgesics, fluids, rest, developing a toileting schedule, and avoiding caffeine.  Discussed strict ER follow-up precautions with the patient.  Patient was in agreement with this plan of care and verbalizes understanding.  All questions were answered.  Patient stable for discharge. Final Clinical Impressions(s) / UC Diagnoses   Final diagnoses:  Acute cystitis without hematuria     Discharge Instructions      Your COVID/flu test was negative.  Your urinalysis shows you have a urinary tract infection.  A urine culture has been ordered to ensure you are being treated with the appropriate antibiotic.  If the medication needs to be changed, you will be contacted.  You also have access to your results via MyChart. Take medication as prescribed. You may continue Tylenol  as needed for pain, fever, or general discomfort. Increase fluids and allow for plenty of rest.  Try to drink at least 8-10 8 ounce glasses of water daily. Develop a toileting schedule that will allow you to urinate at least every 2 hours. Avoid caffeine such as tea, soda, or coffee while symptoms persist. Monitor your symptoms for worsening.  If you continue to experience fever or lower abdominal pain despite being on medication, please go to the emergency department immediately for further evaluation. Follow-up as needed.   ED Prescriptions     Medication Sig Dispense Auth. Provider   sulfamethoxazole-trimethoprim (BACTRIM DS) 800-160 MG tablet Take 1 tablet by mouth 2 (two) times daily for 7 days. 14 tablet  Leath-Warren, Belen Bowers, NP      PDMP not reviewed this encounter.   Hardy Lia, NP 09/22/23 [phone]

## 2023-09-22 NOTE — ED Triage Notes (Signed)
 Pt reports she has had a fever and lower abdominal pain x 3 days    States she just began taking wegovy

## 2023-09-24 LAB — URINE CULTURE: Culture: >=100000 — AB

## 2023-09-25 ENCOUNTER — Ambulatory Visit (HOSPITAL_COMMUNITY): Payer: Self-pay

## 2023-09-25 MED ORDER — NITROFURANTOIN MONOHYD MACRO 100 MG PO CAPS: 100.0000 mg | ORAL_CAPSULE | Freq: Two times a day (BID) | ORAL | 0 refills | Status: AC

## 2023-09-25 NOTE — Telephone Encounter (Signed)
 Pt returned call. Advised of results and med change. Verbalized understanding.

## 2023-09-26 NOTE — Telephone Encounter (Addendum)
 Ok to schedule.  Hold phentermine 7 days. Need pregnancy test.

## 2023-09-27 ENCOUNTER — Encounter (INDEPENDENT_AMBULATORY_CARE_PROVIDER_SITE_OTHER): Payer: Self-pay | Admitting: *Deleted

## 2023-09-27 ENCOUNTER — Encounter: Payer: Self-pay | Admitting: *Deleted

## 2023-09-27 ENCOUNTER — Other Ambulatory Visit: Payer: Self-pay | Admitting: *Deleted

## 2023-09-27 DIAGNOSIS — Z1211 Encounter for screening for malignant neoplasm of colon: Secondary | ICD-10-CM

## 2023-09-27 MED ORDER — PEG 3350-KCL-NA BICARB-NACL 420 G PO SOLR: 4000.0000 mL | Freq: Once | ORAL | 0 refills | Status: AC

## 2023-09-27 NOTE — Addendum Note (Signed)
 Addended by: Alvester Johnson on: 09/27/2023 10:03 AM   Modules accepted: Orders

## 2023-09-27 NOTE — Telephone Encounter (Signed)
 Pt has been scheduled for 11/12/23 with Dr.Carver. instructions mailed and prep sent to pharmacy.

## 2023-09-27 NOTE — Telephone Encounter (Signed)
 Referral completed, TCS apt letter sent to PCP

## 2023-09-27 NOTE — Telephone Encounter (Signed)
ASA 2.  ?

## 2023-09-30 ENCOUNTER — Other Ambulatory Visit (HOSPITAL_COMMUNITY): Payer: Self-pay | Admitting: Family Medicine

## 2023-09-30 DIAGNOSIS — Z87440 Personal history of urinary (tract) infections: Secondary | ICD-10-CM

## 2023-10-09 ENCOUNTER — Ambulatory Visit (HOSPITAL_COMMUNITY)
Admission: RE | Admit: 2023-10-09 | Discharge: 2023-10-09 | Disposition: A | Discharge status: Home / Self Care | Source: Ambulatory Visit | Attending: Family Medicine | Admitting: Family Medicine

## 2023-10-09 DIAGNOSIS — Z87440 Personal history of urinary (tract) infections: Secondary | ICD-10-CM | POA: Insufficient documentation

## 2023-10-16 ENCOUNTER — Other Ambulatory Visit (HOSPITAL_COMMUNITY): Payer: Self-pay | Admitting: Family Medicine

## 2023-10-16 DIAGNOSIS — R928 Other abnormal and inconclusive findings on diagnostic imaging of breast: Secondary | ICD-10-CM

## 2023-11-06 NOTE — Telephone Encounter (Signed)
 Pt informed not to do the wegovy x 7 days prior. Verbalized understanding.    Crystal R Page, RN:  Meredith Hansen. She is scheduled for a colonoscopy on 7/29. Her instructions inform her to stop her phentermine 7 days prior. Her medication list shows she takes wegovy as well. The instructions do not mention anything about stopping that medication. Can we please clarify this information?

## 2023-11-08 LAB — PREGNANCY, URINE: Preg Test, Ur: NEGATIVE

## 2023-11-12 ENCOUNTER — Ambulatory Visit (HOSPITAL_COMMUNITY): Payer: Self-pay | Admitting: Anesthesiology

## 2023-11-12 ENCOUNTER — Ambulatory Visit (HOSPITAL_COMMUNITY)
Admission: RE | Admit: 2023-11-12 | Discharge: 2023-11-12 | Disposition: A | Discharge status: Home / Self Care | Attending: Internal Medicine | Admitting: Internal Medicine

## 2023-11-12 ENCOUNTER — Encounter (HOSPITAL_COMMUNITY)
Admission: RE | Disposition: A | Discharge status: Home / Self Care | Payer: Self-pay | Source: Home / Self Care | Attending: Internal Medicine

## 2023-11-12 ENCOUNTER — Encounter (HOSPITAL_COMMUNITY): Payer: Self-pay | Admitting: Internal Medicine

## 2023-11-12 ENCOUNTER — Other Ambulatory Visit: Payer: Self-pay

## 2023-11-12 DIAGNOSIS — Z1211 Encounter for screening for malignant neoplasm of colon: Secondary | ICD-10-CM | POA: Insufficient documentation

## 2023-11-12 DIAGNOSIS — K219 Gastro-esophageal reflux disease without esophagitis: Secondary | ICD-10-CM | POA: Diagnosis not present

## 2023-11-12 DIAGNOSIS — K635 Polyp of colon: Secondary | ICD-10-CM

## 2023-11-12 DIAGNOSIS — Z7982 Long term (current) use of aspirin: Secondary | ICD-10-CM | POA: Diagnosis not present

## 2023-11-12 DIAGNOSIS — Z79899 Other long term (current) drug therapy: Secondary | ICD-10-CM | POA: Insufficient documentation

## 2023-11-12 DIAGNOSIS — F17290 Nicotine dependence, other tobacco product, uncomplicated: Secondary | ICD-10-CM | POA: Insufficient documentation

## 2023-11-12 DIAGNOSIS — K5730 Diverticulosis of large intestine without perforation or abscess without bleeding: Secondary | ICD-10-CM | POA: Diagnosis not present

## 2023-11-12 DIAGNOSIS — D123 Benign neoplasm of transverse colon: Secondary | ICD-10-CM | POA: Insufficient documentation

## 2023-11-12 DIAGNOSIS — K648 Other hemorrhoids: Secondary | ICD-10-CM | POA: Insufficient documentation

## 2023-11-12 DIAGNOSIS — I10 Essential (primary) hypertension: Secondary | ICD-10-CM | POA: Diagnosis not present

## 2023-11-12 DIAGNOSIS — F17210 Nicotine dependence, cigarettes, uncomplicated: Secondary | ICD-10-CM | POA: Diagnosis not present

## 2023-11-12 HISTORY — PX: COLONOSCOPY: SHX5424

## 2023-11-12 SURGERY — COLONOSCOPY
Anesthesia: General

## 2023-11-12 MED ORDER — LACTATED RINGERS IV SOLN: INTRAVENOUS | Status: DC

## 2023-11-12 MED ORDER — PROPOFOL 500 MG/50ML IV EMUL
INTRAVENOUS | Status: AC
  Filled 2023-11-12: qty 50

## 2023-11-12 MED ORDER — PROPOFOL 500 MG/50ML IV EMUL
INTRAVENOUS | Status: DC | PRN
  Administered 2023-11-12: 60 mg via INTRAVENOUS
  Administered 2023-11-12: 150 ug/kg/min via INTRAVENOUS
  Administered 2023-11-12: 40 mg via INTRAVENOUS

## 2023-11-12 NOTE — Anesthesia Preprocedure Evaluation (Signed)
 Anesthesia Evaluation  Patient identified by MRN, date of birth, ID band Patient awake    Reviewed: Allergy & Precautions, H&P , NPO status , Patient's Chart, lab work & pertinent test results, reviewed documented beta blocker date and time   Airway Mallampati: II  TM Distance: >3 FB Neck ROM: full    Dental no notable dental hx.    Pulmonary neg pulmonary ROS, Current Smoker   Pulmonary exam normal breath sounds clear to auscultation       Cardiovascular Exercise Tolerance: Good hypertension, negative cardio ROS  Rhythm:regular Rate:Normal     Neuro/Psych negative neurological ROS  negative psych ROS   GI/Hepatic negative GI ROS, Neg liver ROS,GERD  ,,(+) Hepatitis -  Endo/Other  negative endocrine ROS    Renal/GU negative Renal ROS  negative genitourinary   Musculoskeletal   Abdominal   Peds  Hematology negative hematology ROS (+)   Anesthesia Other Findings   Reproductive/Obstetrics negative OB ROS                              Anesthesia Physical Anesthesia Plan  ASA: 2  Anesthesia Plan: General   Post-op Pain Management:    Induction:   PONV Risk Score and Plan: Propofol  infusion  Airway Management Planned:   Additional Equipment:   Intra-op Plan:   Post-operative Plan:   Informed Consent: I have reviewed the patients History and Physical, chart, labs and discussed the procedure including the risks, benefits and alternatives for the proposed anesthesia with the patient or authorized representative who has indicated his/her understanding and acceptance.     Dental Advisory Given  Plan Discussed with: CRNA  Anesthesia Plan Comments:         Anesthesia Quick Evaluation

## 2023-11-12 NOTE — Discharge Instructions (Addendum)
  Colonoscopy Discharge Instructions  Read the instructions outlined below and refer to this sheet in the next few weeks. These discharge instructions provide you with general information on caring for yourself after you leave the hospital. Your doctor may also give you specific instructions. While your treatment has been planned according to the most current medical practices available, unavoidable complications occasionally occur.   ACTIVITY You may resume your regular activity, but move at a slower pace for the next 24 hours.  Take frequent rest periods for the next 24 hours.  Walking will help get rid of the air and reduce the bloated feeling in your belly (abdomen).  No driving for 24 hours (because of the medicine (anesthesia) used during the test).   Do not sign any important legal documents or operate any machinery for 24 hours (because of the anesthesia used during the test).  NUTRITION Drink plenty of fluids.  You may resume your normal diet as instructed by your doctor.  Begin with a light meal and progress to your normal diet. Heavy or fried foods are harder to digest and may make you feel sick to your stomach (nauseated).  Avoid alcoholic beverages for 24 hours or as instructed.  MEDICATIONS You may resume your normal medications unless your doctor tells you otherwise.  WHAT YOU CAN EXPECT TODAY Some feelings of bloating in the abdomen.  Passage of more gas than usual.  Spotting of blood in your stool or on the toilet paper.  IF YOU HAD POLYPS REMOVED DURING THE COLONOSCOPY: No aspirin products for 7 days or as instructed.  No alcohol for 7 days or as instructed.  Eat a soft diet for the next 24 hours.  FINDING OUT THE RESULTS OF YOUR TEST Not all test results are available during your visit. If your test results are not back during the visit, make an appointment with your caregiver to find out the results. Do not assume everything is normal if you have not heard from your  caregiver or the medical facility. It is important for you to follow up on all of your test results.  SEEK IMMEDIATE MEDICAL ATTENTION IF: You have more than a spotting of blood in your stool.  Your belly is swollen (abdominal distention).  You are nauseated or vomiting.  You have a temperature over 101.  You have abdominal pain or discomfort that is severe or gets worse throughout the day.   Your colonoscopy revealed 1 polyp(s) which I removed successfully. Await pathology results, my office will contact you. I recommend repeating colonoscopy in 7-10 years for surveillance purposes depending on pathology results. Otherwise follow up with GI as needed.    I hope you have a great rest of your week!  Charles K. Carver, D.O. Gastroenterology and Hepatology [facility]  

## 2023-11-12 NOTE — Op Note (Signed)
 [facility] Patient Name: Meredith Hansen Procedure Date: 11/12/2023 9:04 AM MRN: [id number] Date of Birth: [birth year] Attending MD: Carlin POUR. Cindie , [place], [id number] CSN: [id number] Age: 53 Admit Type: Outpatient Procedure:                Colonoscopy Indications:              Screening for colorectal malignant neoplasm Providers:                Carlin POUR. Cindie, DO, Jon LABOR. Gerome RN, RN,                            Daphne Mulch Technician, Technician Referring MD:              Medicines:                See the Anesthesia note for documentation of the                            administered medications Complications:            No immediate complications. Estimated Blood Loss:     Estimated blood loss was minimal. Procedure:                Pre-Anesthesia Assessment:                           - The anesthesia plan was to use monitored                            anesthesia care (MAC).                           After obtaining informed consent, the colonoscope                            was passed under direct vision. Throughout the                            procedure, the patient's blood pressure, pulse, and                            oxygen saturations were monitored continuously. The                            [id number]-HQ190L ([id number]) scope was introduced through                            the anus and advanced to the the cecum, identified                            by appendiceal orifice and ileocecal valve. The                            colonoscopy was performed without difficulty. The                            patient tolerated  the procedure well. The quality                            of the bowel preparation was evaluated using the                            BBPS [facility] Bowel Preparation Scale) with scores                            of: Right Colon = 2 (minor amount of residual                            staining, small fragments of stool and/or opaque                             liquid, but mucosa seen well), Transverse Colon = 2                            (minor amount of residual staining, small fragments                            of stool and/or opaque liquid, but mucosa seen                            well) and Left Colon = 2 (minor amount of residual                            staining, small fragments of stool and/or opaque                            liquid, but mucosa seen well). The total BBPS score                            equals 6. The quality of the bowel preparation was                            good. Scope In: 9:18:42 AM Scope Out: 9:33:54 AM Scope Withdrawal Time: 0 hours 12 minutes 43 seconds  Total Procedure Duration: 0 hours 15 minutes 12 seconds  Findings:      Non-bleeding internal hemorrhoids were found.      A 5 mm polyp was found in the transverse colon. The polyp was sessile.       The polyp was removed with a cold snare. Resection and retrieval were       complete.      A few small-mouthed diverticula were found in the sigmoid colon.      The exam was otherwise without abnormality. Impression:               - Non-bleeding internal hemorrhoids.                           - One 5 mm polyp in the transverse colon, removed  with a cold snare. Resected and retrieved.                           - Diverticulosis in the sigmoid colon.                           - The examination was otherwise normal. Moderate Sedation:      Per Anesthesia Care Recommendation:           - Patient has a contact number available for                            emergencies. The signs and symptoms of potential                            delayed complications were discussed with the                            patient. Return to normal activities tomorrow.                            Written discharge instructions were provided to the                            patient.                           - Resume previous diet.                            - Continue present medications.                           - Await pathology results.                           - Repeat colonoscopy in 7-10 years depending on                            pathology results for surveillance.                           - Return to GI clinic PRN. Procedure Code(s):        --- Professional ---                           [phone], Colonoscopy, flexible; with removal of                            tumor(s), polyp(s), or other lesion(s) by snare                            technique Diagnosis Code(s):        --- Professional ---                           Z12.11, Encounter for screening for malignant  neoplasm of colon                           D12.3, Benign neoplasm of transverse colon (hepatic                            flexure or splenic flexure)                           K64.8, Other hemorrhoids CPT copyright 2022 American Medical Association. All rights reserved. The codes documented in this report are preliminary and upon coder review may  be revised to meet current compliance requirements. Carlin POUR. Cindie, DO Carlin POUR. Cindie, DO 11/12/2023 9:36:27 AM This report has been signed electronically. Number of Addenda: 0

## 2023-11-12 NOTE — H&P (Signed)
 Primary Care Physician:  Vick Lurie, FNP Primary Gastroenterologist:  Dr. Cindie  Pre-Procedure History & Physical: HPI:  Meredith Hansen is a 53 y.o. female is here for first ever colonoscopy for colon cancer screening purposes.  Patient denies any family history of colorectal cancer.  No melena or hematochezia.  No abdominal pain or unintentional weight loss.  No change in bowel habits.  Overall feels well from a GI standpoint.  Past Medical History:  Diagnosis Date   GERD (gastroesophageal reflux disease)     Past Surgical History:  Procedure Laterality Date   CERVICAL DISCECTOMY  2018   C1-C3   CESAREAN SECTION  1996, 1998, 2004   liposuction, tummy tuck  11/2019    Prior to Admission medications   Medication Sig Start Date End Date Taking? Authorizing Provider  nitrofurantoin , macrocrystal-monohydrate, (MACROBID ) 100 MG capsule Take 1 capsule (100 mg total) by mouth 2 (two) times daily. 09/25/23  Yes Banister, Pamela K, MD  traZODone (DESYREL) 100 MG tablet Take 2 tablets by mouth daily.   Yes [provider]  aspirin EC 81 MG tablet Take 81 mg by mouth daily. Swallow whole.    [provider]  phentermine (ADIPEX-P) 37.5 MG tablet Take 37.5 mg by mouth daily before breakfast. 08/20/23   [provider]  WEGOVY 0.25 MG/0.5ML SOAJ INJECT 1 SYRINGE SUBCUTANEOUSLY ONCE A WEEK FOR  WEIGHT  LOSS    [provider]    Allergies as of 09/27/2023   (No Known Allergies)    Family History  Problem Relation Age of Onset   Dementia Maternal Grandmother    Dementia Maternal Grandfather    Stroke Father    Dementia Mother    Heart disease Mother    Diabetes Mother    Heart murmur Mother    Parkinson's disease Mother    Stroke Brother     Social History   Socioeconomic History   Marital status: Married    Spouse name: Not on file   Number of children: 3   Years of education: Not on file   Highest education level: Not on file   Occupational History   Not on file  Tobacco Use   Smoking status: Every Day    Current packs/day: 0.25    Types: Cigarettes   Smokeless tobacco: Never  Vaping Use   Vaping status: Every Day  Substance and Sexual Activity   Alcohol use: Never   Drug use: Never   Sexual activity: Not Currently    Birth control/protection: None  Other Topics Concern   Not on file  Social History Narrative   Not on file   Social Drivers of Health   Financial Resource Strain: Low Risk  (05/03/2020)   Overall Financial Resource Strain (CARDIA)    Difficulty of Paying Living Expenses: Not hard at all  Food Insecurity: No Food Insecurity (05/03/2020)   Hunger Vital Sign    Worried About Running Out of Food in the Last Year: Never true    Ran Out of Food in the Last Year: Never true  Transportation Needs: No Transportation Needs (05/03/2020)   PRAPARE - Administrator, Civil Service (Medical): No    Lack of Transportation (Non-Medical): No  Physical Activity: Sufficiently Active (05/03/2020)   Exercise Vital Sign    Days of Exercise per Week: 5 days    Minutes of Exercise per Session: 100 min  Stress: Not on file (02/21/2023)  Social Connections: Socially Integrated (05/03/2020)   Social Connection  and Isolation Panel    Frequency of Communication with Friends and Family: More than three times a week    Frequency of Social Gatherings with Friends and Family: Once a week    Attends Religious Services: More than 4 times per year    Active Member of Clubs or Organizations: Yes    Attends Banker Meetings: More than 4 times per year    Marital Status: Married  Catering manager Violence: Not At Risk (05/03/2020)   Humiliation, Afraid, Rape, and Kick questionnaire    Fear of Current or Ex-Partner: No    Emotionally Abused: No    Physically Abused: No    Sexually Abused: No    Review of Systems: See HPI, otherwise negative ROS  Physical Exam: Vital signs in last 24  hours: Temp:  [97.8 F (36.6 C)] 97.8 F (36.6 C) (07/29 0841) Pulse Rate:  [81] 81 (07/29 0841) Resp:  [16] 16 (07/29 0841) BP: (114)/(94) 114/94 (07/29 0841) SpO2:  [95 %] 95 % (07/29 0841) Weight:  [97.1 kg] 97.1 kg (07/29 0841)   General:   Alert,  Well-developed, well-nourished, pleasant and cooperative in NAD Head:  Normocephalic and atraumatic. Eyes:  Sclera clear, no icterus.   Conjunctiva pink. Ears:  Normal auditory acuity. Nose:  No deformity, discharge,  or lesions. Msk:  Symmetrical without gross deformities. Normal posture. Extremities:  Without clubbing or edema. Neurologic:  Alert and  oriented x4;  grossly normal neurologically. Skin:  Intact without significant lesions or rashes. Psych:  Alert and cooperative. Normal mood and affect.  Impression/Plan: Meredith Hansen is here for a colonoscopy to be performed for colon cancer screening purposes.  The risks of the procedure including infection, bleed, or perforation as well as benefits, limitations, alternatives and imponderables have been reviewed with the patient. Questions have been answered. All parties agreeable.

## 2023-11-12 NOTE — Transfer of Care (Signed)
 Immediate Anesthesia Transfer of Care Note  Patient: Meredith Hansen  Procedure(s) Performed: COLONOSCOPY  Patient Location: Endoscopy Unit  Anesthesia Type:General  Level of Consciousness: awake, alert , and oriented  Airway & Oxygen Therapy: Patient Spontanous Breathing  Post-op Assessment: Report given to RN, Post -op Vital signs reviewed and stable, Patient moving all extremities X 4, and Patient able to stick tongue midline  Post vital signs: Reviewed and stable  Last Vitals:  Vitals Value Taken Time  BP 113/53 11/12/23 09:37  Temp 36.6 C 11/12/23 09:37  Pulse 74 11/12/23 09:37  Resp 18 11/12/23 09:37  SpO2 97 % 11/12/23 09:37    Last Pain:  Vitals:   11/12/23 9062  TempSrc: Oral  PainSc: 0-No pain      Patients Stated Pain Goal: 4 (11/12/23 0841)  Complications: No notable events documented.

## 2023-11-13 ENCOUNTER — Encounter (HOSPITAL_COMMUNITY): Payer: Self-pay | Admitting: Internal Medicine

## 2023-11-13 LAB — SURGICAL PATHOLOGY

## 2023-11-13 NOTE — Anesthesia Postprocedure Evaluation (Signed)
 Anesthesia Post Note  Patient: Meredith Hansen  Procedure(s) Performed: COLONOSCOPY  Patient location during evaluation: Phase II Anesthesia Type: General Level of consciousness: awake Pain management: pain level controlled Vital Signs Assessment: post-procedure vital signs reviewed and stable Respiratory status: spontaneous breathing and respiratory function stable Cardiovascular status: blood pressure returned to baseline and stable Postop Assessment: no headache and no apparent nausea or vomiting Anesthetic complications: no Comments: Late entry   No notable events documented.   Last Vitals:  Vitals:   11/12/23 0937 11/12/23 0939  BP: (!) 89/53 104/68  Pulse: 74 81  Resp: 18 20  Temp: 36.6 C   SpO2: 97% 98%    Last Pain:  Vitals:   11/12/23 0937  TempSrc: Oral  PainSc: 0-No pain                 Yvonna JINNY Bosworth

## 2023-11-14 ENCOUNTER — Ambulatory Visit: Payer: Self-pay | Admitting: Internal Medicine

## 2023-11-18 NOTE — Progress Notes (Signed)
 Done

## 2023-11-26 ENCOUNTER — Ambulatory Visit (HOSPITAL_COMMUNITY)
Admission: RE | Admit: 2023-11-26 | Discharge: 2023-11-26 | Disposition: A | Discharge status: Home / Self Care | Source: Ambulatory Visit | Attending: Family Medicine | Admitting: Family Medicine

## 2023-11-26 DIAGNOSIS — R928 Other abnormal and inconclusive findings on diagnostic imaging of breast: Secondary | ICD-10-CM | POA: Diagnosis present
